# Patient Record
Sex: Male | Born: 1985 | Race: Black or African American | Hispanic: No | Marital: Single | Smoking: Current every day smoker
Health system: Southern US, Community
[De-identification: ages and names within clinical notes are randomized; demographics above are authoritative.]

## PROBLEM LIST (undated history)

## (undated) DIAGNOSIS — Z973 Presence of spectacles and contact lenses: Secondary | ICD-10-CM

## (undated) DIAGNOSIS — F1210 Cannabis abuse, uncomplicated: Secondary | ICD-10-CM

## (undated) DIAGNOSIS — E349 Endocrine disorder, unspecified: Secondary | ICD-10-CM

## (undated) DIAGNOSIS — S82142A Displaced bicondylar fracture of left tibia, initial encounter for closed fracture: Secondary | ICD-10-CM

## (undated) DIAGNOSIS — F17200 Nicotine dependence, unspecified, uncomplicated: Secondary | ICD-10-CM

## (undated) DIAGNOSIS — E559 Vitamin D deficiency, unspecified: Secondary | ICD-10-CM

---

## 2010-06-29 ENCOUNTER — Emergency Department: Payer: Self-pay | Admitting: Emergency Medicine

## 2011-12-17 ENCOUNTER — Encounter (HOSPITAL_COMMUNITY): Payer: Self-pay

## 2011-12-17 ENCOUNTER — Emergency Department (HOSPITAL_COMMUNITY): Payer: Self-pay

## 2011-12-17 ENCOUNTER — Emergency Department (HOSPITAL_COMMUNITY)
Admission: EM | Admit: 2011-12-17 | Discharge: 2011-12-17 | Disposition: A | Discharge status: Home / Self Care | Payer: Self-pay | Attending: Emergency Medicine | Admitting: Emergency Medicine

## 2011-12-17 DIAGNOSIS — Y92009 Unspecified place in unspecified non-institutional (private) residence as the place of occurrence of the external cause: Secondary | ICD-10-CM | POA: Insufficient documentation

## 2011-12-17 DIAGNOSIS — X838XXA Intentional self-harm by other specified means, initial encounter: Secondary | ICD-10-CM | POA: Insufficient documentation

## 2011-12-17 DIAGNOSIS — S62309A Unspecified fracture of unspecified metacarpal bone, initial encounter for closed fracture: Secondary | ICD-10-CM | POA: Insufficient documentation

## 2011-12-17 MED ORDER — OXYCODONE-ACETAMINOPHEN 5-325 MG PO TABS
2.0000 | ORAL_TABLET | Freq: Once | ORAL | Status: AC
  Administered 2011-12-17: 2 via ORAL
  Filled 2011-12-17: qty 2

## 2011-12-17 MED ORDER — OXYCODONE-ACETAMINOPHEN 5-325 MG PO TABS: 1.0000 | ORAL_TABLET | Freq: Four times a day (QID) | ORAL | Status: AC | PRN

## 2011-12-17 NOTE — ED Provider Notes (Signed)
History     CSN: [id number]  Arrival date & time 12/17/11  1059   First MD Initiated Contact with Patient 12/17/11 1117      Chief Complaint  Patient presents with  . Hand Injury    (Consider location/radiation/quality/duration/timing/severity/associated sxs/prior treatment) HPI Comments: Patient reports that last evening he punched a wall and is now having pain of his left hand.  He reports that he had a two previous fractures of his hand.  No bleeding from the area.  No numbness/tingling.  He has not taken anything for pain.  Patient is a 26 y.o. male presenting with hand injury. The history is provided by the patient.  Hand Injury  The incident occurred 12 to 24 hours ago. The incident occurred at home. The pain is present in the left hand. The quality of the pain is described as throbbing. The pain is moderate. The pain has been constant since the incident. Pertinent negatives include no fever. He reports no foreign bodies present. The symptoms are aggravated by palpation. He has tried nothing for the symptoms.    History reviewed. No pertinent past medical history.  History reviewed. No pertinent past surgical history.  No family history on file.  History  Substance Use Topics  . Smoking status: Current Everyday Smoker  . Smokeless tobacco: Not on file  . Alcohol Use: Yes      Review of Systems  Constitutional: Negative for fever and chills.  Gastrointestinal: Negative for nausea and vomiting.  Musculoskeletal: Positive for joint swelling.  Skin: Positive for color change.  Neurological: Negative for numbness.    Allergies  Review of patient's allergies indicates no known allergies.  Home Medications  No current outpatient prescriptions on file.  BP 132/85  Pulse 95  Temp 99 F (37.2 C) (Oral)  Resp 18  SpO2 100%  Physical Exam  Nursing note and vitals reviewed. Constitutional: He appears well-developed and well-nourished. No distress.  HENT:  Head:  Normocephalic and atraumatic.  Mouth/Throat: Oropharynx is clear and moist.  Cardiovascular: Normal rate, regular rhythm and normal heart sounds.   Pulses:      Radial pulses are 2+ on the right side, and 2+ on the left side.  Pulmonary/Chest: Effort normal and breath sounds normal.  Musculoskeletal:       Left wrist: He exhibits normal range of motion, no tenderness, no bony tenderness, no swelling and no deformity.       Swelling and tenderness to palpation of the dorsal left hand over the 4th and 5th metacarpal.  Neurological: He is alert. No sensory deficit. Gait normal.  Skin: Skin is warm, dry and intact. He is not diaphoretic.  Psychiatric: He has a normal mood and affect.    ED Course  Procedures (including critical care time)  Labs Reviewed - No data to display Dg Hand Complete Left  12/17/2011  *RADIOLOGY REPORT*  Clinical Data: Left hand pain after hitting a wall.  Previous left hand fractures.  LEFT HAND - COMPLETE 3+ VIEW  Comparison: None.  Findings: Old, healed fracture deformity of the distal fifth metacarpal.  Old, healed fracture deformity of the mid fourth metacarpal.  There is also a visible fracture line at that location with some overlying soft tissue swelling.  There is some dorsal and ulnar displacement of the distal fragment as well as ventral angulation of the distal fragment which appears chronic.  IMPRESSION:  1.  Refracture of an old, healed fourth metacarpal fracture without acute displacement. 2.  Old,  healed fifth metacarpal fracture.  Original Report Authenticated By: Darrol Angel, M.D.     No diagnosis found.    MDM  Patient with fourth metacarpal fracture without acute displacement.  Patient neurovascularly intact.  Patient given ulnar gutter splint and instructed to follow up with Orthopedics.        Pascal Lux [place], PA-C 12/17/11 2044

## 2011-12-17 NOTE — ED Notes (Signed)
Pt in from home with left hand injury states punched a wall last night present with pain and swelling to the area states pain when moving

## 2011-12-18 NOTE — ED Provider Notes (Signed)
Medical screening examination/treatment/procedure(s) were performed by non-physician practitioner and as supervising physician I was immediately available for consultation/collaboration.  Ethelda Chick, MD 12/18/11 [phone]

## 2013-08-12 ENCOUNTER — Emergency Department (HOSPITAL_COMMUNITY)
Admission: EM | Admit: 2013-08-12 | Discharge: 2013-08-12 | Disposition: A | Discharge status: Home / Self Care | Payer: Self-pay | Attending: Emergency Medicine | Admitting: Emergency Medicine

## 2013-08-12 ENCOUNTER — Encounter (HOSPITAL_COMMUNITY): Payer: Self-pay | Admitting: Emergency Medicine

## 2013-08-12 DIAGNOSIS — G479 Sleep disorder, unspecified: Secondary | ICD-10-CM | POA: Insufficient documentation

## 2013-08-12 DIAGNOSIS — K0889 Other specified disorders of teeth and supporting structures: Secondary | ICD-10-CM

## 2013-08-12 DIAGNOSIS — F17200 Nicotine dependence, unspecified, uncomplicated: Secondary | ICD-10-CM | POA: Insufficient documentation

## 2013-08-12 DIAGNOSIS — R51 Headache: Secondary | ICD-10-CM | POA: Insufficient documentation

## 2013-08-12 DIAGNOSIS — K089 Disorder of teeth and supporting structures, unspecified: Secondary | ICD-10-CM | POA: Insufficient documentation

## 2013-08-12 MED ORDER — HYDROCODONE-ACETAMINOPHEN 5-325 MG PO TABS
2.0000 | ORAL_TABLET | Freq: Once | ORAL | Status: AC
  Administered 2013-08-12: 2 via ORAL
  Filled 2013-08-12: qty 2

## 2013-08-12 MED ORDER — HYDROCODONE-ACETAMINOPHEN 5-325 MG PO TABS: 1.0000 | ORAL_TABLET | Freq: Four times a day (QID) | ORAL | Status: DC | PRN

## 2013-08-12 MED ORDER — PENICILLIN V POTASSIUM 500 MG PO TABS: 500.0000 mg | ORAL_TABLET | Freq: Three times a day (TID) | ORAL | Status: DC

## 2013-08-12 NOTE — Discharge Instructions (Signed)

## 2013-08-12 NOTE — ED Notes (Signed)
Patient states has had R upper molar pain x 2 days.   Patient states broken tooth.  Patient states has been taken generic pain pills from [facility].

## 2013-08-12 NOTE — ED Provider Notes (Signed)
CSN: [id number]     Arrival date & time 08/12/13  0818 History   First MD Initiated Contact with Patient 08/12/13 [phone]     No chief complaint on file.    (Consider location/radiation/quality/duration/timing/severity/associated sxs/prior Treatment) HPI Comments: Patient presents to the ED with a chief complaint of severe dental pain.  Patient states the pain started 2 days ago.  He reports having a broken tooth.  He states the pain is his upper left molar.  He denies any fever, chills, nausea, or vomiting.  He reports associated headache.  He has tried taking ibuprofen with no relief.  Nothing makes the symptoms better or worse.  He does not have a dentist, and requests a referral.  The history is provided by the patient. No language interpreter was used.    No past medical history on file. No past surgical history on file. No family history on file. History  Substance Use Topics  . Smoking status: Current Every Day Smoker  . Smokeless tobacco: Not on file  . Alcohol Use: Yes    Review of Systems  Constitutional: Negative for fever and chills.  HENT: Positive for dental problem. Negative for drooling.   Neurological: Negative for speech difficulty.  Psychiatric/Behavioral: Positive for sleep disturbance.      Allergies  Review of patient's allergies indicates no known allergies.  Home Medications  No current outpatient prescriptions on file. There were no vitals taken for this visit. Physical Exam  Nursing note and vitals reviewed. Constitutional: He is oriented to person, place, and time. He appears well-developed and well-nourished.  HENT:  Head: Normocephalic and atraumatic.  Mouth/Throat:    Poor dentition throughout.  Affected tooth as diagrammed.  No signs of peritonsillar or tonsillar abscess.  No signs of gingival abscess. Oropharynx is clear and without exudates.  Uvula is midline.  Airway is intact. No signs of Ludwig's angina with palpation of oral and  sublingual mucosa.   Eyes: Conjunctivae and EOM are normal.  Neck: Normal range of motion.  Cardiovascular: Normal rate.   Pulmonary/Chest: Effort normal.  Abdominal: He exhibits no distension.  Musculoskeletal: Normal range of motion.  Neurological: He is alert and oriented to person, place, and time.  Skin: Skin is dry.  Psychiatric: He has a normal mood and affect. His behavior is normal. Judgment and thought content normal.    ED Course  Procedures (including critical care time) Labs Review Labs Reviewed - No data to display Imaging Review No results found.   EKG Interpretation None      MDM   Final diagnoses:  Pain, dental    Patient with toothache.  No gross abscess.  Exam unconcerning for Ludwig's angina or spread of infection.  Will treat with penicillin and pain medicine.  Urged patient to follow-up with dentist.       Roxy Horsemanobert Nicolle Heward, PA-C 08/12/13 [phone]

## 2013-08-12 NOTE — ED Provider Notes (Signed)
Medical screening examination/treatment/procedure(s) were performed by non-physician practitioner and as supervising physician I was immediately available for consultation/collaboration.   EKG Interpretation None       Craig SouSam Divine Hansley, MD 08/12/13 [phone]

## 2014-10-16 ENCOUNTER — Encounter (HOSPITAL_COMMUNITY): Payer: Self-pay | Admitting: Emergency Medicine

## 2014-10-16 ENCOUNTER — Emergency Department (HOSPITAL_COMMUNITY): Payer: Self-pay

## 2014-10-16 ENCOUNTER — Emergency Department (HOSPITAL_COMMUNITY)
Admission: EM | Admit: 2014-10-16 | Discharge: 2014-10-16 | Disposition: A | Discharge status: Home / Self Care | Payer: Self-pay | Attending: Emergency Medicine | Admitting: Emergency Medicine

## 2014-10-16 DIAGNOSIS — Y929 Unspecified place or not applicable: Secondary | ICD-10-CM | POA: Insufficient documentation

## 2014-10-16 DIAGNOSIS — S62336A Displaced fracture of neck of fifth metacarpal bone, right hand, initial encounter for closed fracture: Secondary | ICD-10-CM | POA: Insufficient documentation

## 2014-10-16 DIAGNOSIS — Y939 Activity, unspecified: Secondary | ICD-10-CM | POA: Insufficient documentation

## 2014-10-16 DIAGNOSIS — S62308A Unspecified fracture of other metacarpal bone, initial encounter for closed fracture: Secondary | ICD-10-CM

## 2014-10-16 DIAGNOSIS — Y999 Unspecified external cause status: Secondary | ICD-10-CM | POA: Insufficient documentation

## 2014-10-16 DIAGNOSIS — S62344A Nondisplaced fracture of base of fourth metacarpal bone, right hand, initial encounter for closed fracture: Secondary | ICD-10-CM | POA: Insufficient documentation

## 2014-10-16 DIAGNOSIS — Z720 Tobacco use: Secondary | ICD-10-CM | POA: Insufficient documentation

## 2014-10-16 DIAGNOSIS — W2209XA Striking against other stationary object, initial encounter: Secondary | ICD-10-CM | POA: Insufficient documentation

## 2014-10-16 MED ORDER — NAPROXEN 500 MG PO TABS: 500.0000 mg | ORAL_TABLET | Freq: Two times a day (BID) | ORAL | Status: DC

## 2014-10-16 MED ORDER — OXYCODONE-ACETAMINOPHEN 5-325 MG PO TABS
1.0000 | ORAL_TABLET | Freq: Once | ORAL | Status: AC
  Administered 2014-10-16: 1 via ORAL
  Filled 2014-10-16: qty 1

## 2014-10-16 MED ORDER — LIDOCAINE HCL (PF) 2 % IJ SOLN
10.0000 mL | Freq: Once | INTRAMUSCULAR | Status: AC
  Administered 2014-10-16: 10 mL
  Filled 2014-10-16: qty 10

## 2014-10-16 MED ORDER — OXYCODONE-ACETAMINOPHEN 5-325 MG PO TABS: 1.0000 | ORAL_TABLET | Freq: Four times a day (QID) | ORAL | Status: DC | PRN

## 2014-10-16 NOTE — Progress Notes (Signed)
CSW spoke with patient per patient request.  Patient requesting bus passes as well as information regarding Medicaid  application.  Patient states he had Medicaid as a child but has never applied as an adult. Provided information, education regarding Medicaid application. Also supplied bus passes. No further needs expressed.Gerrie NordmannMichelle Barrett-Hilton, LCSW [phone]

## 2014-10-16 NOTE — ED Notes (Signed)
Patient states hit wall last night with R hand.   Patient has good radial pulse, good cap refill.   Patient does have limited movement of hand at this time.

## 2014-10-16 NOTE — ED Provider Notes (Signed)
CSN: [id number]     Arrival date & time 10/16/14  0722 History   First MD Initiated Contact with Patient 10/16/14 [phone]     Chief Complaint  Patient presents with  . Hand Injury    (Consider location/radiation/quality/duration/timing/severity/associated sxs/prior Treatment) HPI Comments: Patient presents with complaint of right hand pain after he punched a wall last night at approximately 10 PM. Paitent applied an Ace wrap at home. Pain and swelling worsens overnight. Patient denies numbness or tingling. The onset of this condition was acute. The course is constant. Aggravating factors: movement. Alleviating factors: none.    Patient is a 29 y.o. male presenting with hand injury. The history is provided by the patient.  Hand Injury Associated symptoms: no back pain and no neck pain     History reviewed. No pertinent past medical history. History reviewed. No pertinent past surgical history. No family history on file. History  Substance Use Topics  . Smoking status: Current Every Day Smoker -- 0.50 packs/day    Types: Cigarettes  . Smokeless tobacco: Not on file  . Alcohol Use: Yes     Comment: socially    Review of Systems  Constitutional: Positive for activity change.  Musculoskeletal: Positive for joint swelling and arthralgias. Negative for back pain, gait problem and neck pain.  Skin: Negative for wound.  Neurological: Negative for weakness and numbness.      Allergies  Vicodin  Home Medications   Prior to Admission medications   Not on File   BP 114/87 mmHg  Pulse 84  Temp(Src) 97.7 F (36.5 C) (Oral)  Resp 19  SpO2 100%   Physical Exam  Constitutional: He appears well-developed and well-nourished.  HENT:  Head: Normocephalic and atraumatic.  Eyes: Conjunctivae are normal.  Neck: Normal range of motion. Neck supple.  Cardiovascular: Normal pulses.   Musculoskeletal: He exhibits edema and tenderness.       Right hand: He exhibits decreased range of  motion, tenderness, bony tenderness, deformity and swelling. He exhibits normal capillary refill and no laceration. Normal sensation noted. Normal strength noted.       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   Neurological: He is alert. No sensory deficit.  Motor, sensation, and vascular distal to the injury is fully intact.   Skin: Skin is warm and dry.  Psychiatric: He has a normal mood and affect.  Nursing note and vitals reviewed.   ED Course  Reduction of fracture Performed by: Renne CriglerGEIPLE, Mahima Hottle Authorized by: Renne CriglerGEIPLE, Stephine Langbehn Consent: Verbal consent obtained. Risks and benefits: risks, benefits and alternatives were discussed Consent given by: patient Patient understanding: patient states understanding of the procedure being performed Imaging studies: imaging studies available (x-rays and palpation used to identify injection site) Patient identity confirmed: verbally with patient, arm band and provided demographic data Preparation: Patient was prepped and draped in the usual sterile fashion. Local anesthesia used: yes Anesthesia: hematoma block and digital block Local anesthetic: lidocaine 2% without epinephrine Anesthetic total: 5 ml Patient sedated: no Patient tolerance: Patient tolerated the procedure well with no immediate complications Comments: Once hematoma block performed patient had good anesthesia of joint but continued to have finger pain, so 3-sided ring block performed without complication with good results.    (including critical care time) Labs Review Labs Reviewed - No data to display  Imaging Review Dg Hand Complete Right  10/16/2014   CLINICAL DATA:  Patient punched wall yesterday  EXAM: RIGHT HAND - COMPLETE 3+ VIEW  COMPARISON:  None.  FINDINGS: Frontal, oblique, and lateral views obtained. There are old healed fractures of the proximal to mid fourth and fifth metacarpals with remodeling. Currently, there is an acute fracture of the  distal fifth metacarpal with volar angulation distally. There is a nondisplaced fracture of the proximal fourth metacarpal. No other fractures are identified. No dislocation. Joint spaces appear intact.  IMPRESSION: Acute fracture of the distal fifth metacarpal with volar angulation distally. Nondisplaced acute fracture proximal fourth metacarpal. Older fractures of the fourth and fifth metacarpals with remodeling involving both bones. No dislocations. No appreciable arthropathy.   Electronically Signed   By: Bretta BangWilliam  Woodruff III M.D.   On: 10/16/2014 08:04     EKG Interpretation None       8:19 AM Patient seen and examined. Medications ordered.   Vital signs reviewed and are as follows: BP 114/87 mmHg  Pulse 84  Temp(Src) 97.7 F (36.5 C) (Oral)  Resp 19  SpO2 100%  Images reviewed with Dr. Rubin PayorPickering. Will ask ortho for reccs.    I spoke with someone at Dr. Debby Budoley's office regarding this patient. After several pages, I never received a call back with recommendations.   My interpretation of x-rays shows >30 degrees of volar angulation. Spoke with Dr. Rubin PayorPickering who agrees attempt at reduction with hematoma block is warranted. I explained this to the patient and he agrees to proceed.   Hematoma block performed and attempted reduction as above. Finger remained well perfused before and after procedure with normal capillary refill with motor intact. Sensation decreased as expected.   Ulnar gutter splint placed by ortho tech.   Pt d/c to home with hand follow-up, instructed to call for an appointment asap. Patient was counseled on RICE protocol and told to rest injury, use ice for no longer than 15 minutes every hour, compress the area, and elevate above the level of their heart as much as possible to reduce swelling. Questions answered. Patient verbalized understanding.     Patient counseled on use of narcotic pain medications. Counseled not to combine these medications with others  containing tylenol. Urged not to drink alcohol, drive, or perform any other activities that requires focus while taking these medications. The patient verbalizes understanding and agrees with the plan.     MDM   Final diagnoses:  Closed fracture of 5th metacarpal, initial encounter  Closed fracture of 4th metacarpal, initial encounter   Pt with fractures with treatment as above. Referred to ortho hand.     Renne CriglerJoshua Nickayla Mcinnis, PA-C 10/16/14 1638  Benjiman CoreNathan Pickering, MD 10/17/14 [phone]

## 2014-10-16 NOTE — Progress Notes (Signed)
Orthopedic Tech Progress Note Patient Details:  Shirlyn GoltzDemaris S Monjaras December 15, 1985 [id number]  Ortho Devices Type of Ortho Device: Ace wrap, Volar splint Ortho Device/Splint Location: rue Ortho Device/Splint Interventions: Application   Shuaib Corsino 10/16/2014, 11:06 AM

## 2014-10-16 NOTE — Discharge Instructions (Signed)
Please read and follow all provided instructions.  Your diagnoses today include:  1. Closed fracture of 5th metacarpal, initial encounter   2. Closed fracture of 4th metacarpal, initial encounter     Tests performed today include:  An x-ray of the affected area - shows broken 4th and 5th metacarpal  Vital signs. See below for your results today.   Medications prescribed:   Percocet (oxycodone/acetaminophen) - narcotic pain medication  DO NOT drive or perform any activities that require you to be awake and alert because this medicine can make you drowsy. BE VERY CAREFUL not to take multiple medicines containing Tylenol (also called acetaminophen). Doing so can lead to an overdose which can damage your liver and cause liver failure and possibly death.   Naproxen - anti-inflammatory pain medication  Do not exceed 500mg  naproxen every 12 hours, take with food  You have been prescribed an anti-inflammatory medication or NSAID. Take with food. Take smallest effective dose for the shortest duration needed for your pain. Stop taking if you experience stomach pain or vomiting.   Take any prescribed medications only as directed.  Home care instructions:   Follow any educational materials contained in this packet  Follow R.I.C.E. Protocol:  R - rest your injury   I  - use ice on injury without applying directly to skin  C - compress injury with bandage or splint  E - elevate the injury as much as possible  Follow-up instructions: Please follow-up with the provided orthopedic physician (bone specialist) in the next week.  Return instructions:   Please return if your fingers are numb or tingling, appear gray or blue, or you have severe pain (also elevate the arm and loosen splint or wrap if you were given one)  Please return to the Emergency Department if you experience worsening symptoms.   Please return if you have any other emergent concerns.  Additional  Information:  Your vital signs today were: BP 114/87 mmHg   Pulse 84   Temp(Src) 97.7 F (36.5 C) (Oral)   Resp 19   SpO2 100% If your blood pressure (BP) was elevated above 135/85 this visit, please have this repeated by your doctor within one month. --------------

## 2014-10-20 ENCOUNTER — Encounter (HOSPITAL_COMMUNITY): Payer: Self-pay | Admitting: Emergency Medicine

## 2014-10-26 ENCOUNTER — Emergency Department (HOSPITAL_COMMUNITY): Payer: Self-pay

## 2014-10-26 ENCOUNTER — Encounter (HOSPITAL_COMMUNITY): Payer: Self-pay | Admitting: Emergency Medicine

## 2014-10-26 ENCOUNTER — Emergency Department (HOSPITAL_COMMUNITY)
Admission: EM | Admit: 2014-10-26 | Discharge: 2014-10-26 | Disposition: A | Discharge status: Home / Self Care | Payer: Self-pay | Attending: Emergency Medicine | Admitting: Emergency Medicine

## 2014-10-26 DIAGNOSIS — S62318K Displaced fracture of base of other metacarpal bone, subsequent encounter for fracture with nonunion: Secondary | ICD-10-CM

## 2014-10-26 DIAGNOSIS — S62316D Displaced fracture of base of fifth metacarpal bone, right hand, subsequent encounter for fracture with routine healing: Secondary | ICD-10-CM | POA: Insufficient documentation

## 2014-10-26 DIAGNOSIS — Z720 Tobacco use: Secondary | ICD-10-CM | POA: Insufficient documentation

## 2014-10-26 DIAGNOSIS — Z791 Long term (current) use of non-steroidal anti-inflammatories (NSAID): Secondary | ICD-10-CM | POA: Insufficient documentation

## 2014-10-26 DIAGNOSIS — Z792 Long term (current) use of antibiotics: Secondary | ICD-10-CM | POA: Insufficient documentation

## 2014-10-26 DIAGNOSIS — X58XXXD Exposure to other specified factors, subsequent encounter: Secondary | ICD-10-CM | POA: Insufficient documentation

## 2014-10-26 MED ORDER — OXYCODONE-ACETAMINOPHEN 5-325 MG PO TABS: 1.0000 | ORAL_TABLET | Freq: Four times a day (QID) | ORAL | Status: DC | PRN

## 2014-10-26 MED ORDER — OXYCODONE-ACETAMINOPHEN 5-325 MG PO TABS
1.0000 | ORAL_TABLET | Freq: Once | ORAL | Status: DC
  Filled 2014-10-26: qty 1

## 2014-10-26 NOTE — ED Notes (Signed)
Patient here with right hand pain, after getting cast wet.  Patient is supposed to follow up with Ortho today.  The splint got wet on Thursday.

## 2014-10-26 NOTE — ED Notes (Signed)
Ortho tech paged for splint.

## 2014-10-26 NOTE — Discharge Instructions (Signed)
You need to follow-up with your hand surgeon later today.  Hand Fracture, Fifth Metacarpal The small metacarpal is the bone at the base of the little finger between the knuckle and the wrist. A fracture is a break in that bone. One of the fractures that is common to this bone is called a Boxer's Fracture. TREATMENT These fractures can be treated with:   Reduction (bones moved back into place), then pinned through the skin to maintain the position, and then casted for about 6 weeks or as your caregiver determines necessary.  ORIF (open reduction and internal fixation) - the fracture site is opened and the bone pieces are fixed into place with pins and then casted for approximately 6 weeks or as your caregiver determines necessary. Your caregiver will discuss the type of fracture you have and the treatment that should be best for that problem. If surgery is the treatment of choice, the following is information for you to know, and also let your caregiver know about prior to surgery.  LET YOUR CAREGIVER KNOW ABOUT:  Allergies.  Medications taken including herbs, eye drops, over the counter medications, and creams.  Use of steroids (by mouth or creams).  Previous problems with anesthetics or novocaine.  Possibility of pregnancy, if this applies.  History of blood clots (thrombophlebitis).  History of bleeding or blood problems.  Previous surgery.  Other health problems. AFTER THE PROCEDURE After surgery, you will be taken to the recovery area where a nurse will watch and check your progress. Once you're awake, stable, and taking fluids well, barring other problems you'll be allowed to go home. Once home an ice pack applied to your operative site may help with discomfort and keep the swelling down. HOME CARE INSTRUCTIONS   Follow your caregiver's instructions as to activities, exercises, physical therapy, and driving a car.  Daily exercise is helpful for maintaining range of motion  (movement and mobility) and strength. Exercise as instructed.  To lessen swelling, keep the injured hand elevated above the level of your heart as much as possible.  Apply ice to the injury for 15-20 minutes each hour while awake for the first 2 days. Put the ice in a plastic bag and place a thin towel between the bag of ice and your cast.  Move the fingers of your casted hand at least several times a day.  If a plaster or fiberglass cast was applied:  Do not try to scratch the skin under the cast using a sharp or pointed object.  Check the skin around the cast every day. You may put lotion on red or sore areas.  Keep your cast dry. Your cast can be protected during bathing with a plastic bag. Do not put your cast into the water.  If a plaster splint was applied:  Wear the splint for as long as directed by your caregiver or until seen for follow-up examination.  Do not get your splint wet. Protect it during bathing with a plastic bag.  You may loosen the elastic bandage around the splint if your fingers start to get numb, tingle, get cold or turn blue.  Do not put pressure on your cast or splint; this may cause it to break. Especially, do not lean plaster casts on hard surfaces for 24 hours after application.  Take medications as directed by your caregiver.  Only take over-the-counter or prescription medicines for pain, discomfort, or fever as directed by your caregiver.  Follow all instructions for physician referrals, physical therapy,  and rehabilitation. Any delay in obtaining necessary care could result in permanent injury, disability and chronic pain. SEEK MEDICAL CARE IF:   Increased bleeding (more than a small spot) from the wound or from beneath your cast or splint if there is a wound beneath the cast from surgery.  Redness, swelling, or increasing pain in the wound or from beneath your cast or splint.  Pus coming from wound or from beneath your cast or splint.  An  unexplained oral temperature above 102 F (38.9 C) develops.  A foul smell coming from the wound or dressing or from beneath your cast or splint.  You are unable to move your little finger. SEEK IMMEDIATE MEDICAL CARE IF:  You develop a rash, have difficulty breathing, or have any allergy problems. If you do not have a window in your cast for observing the wound, a discharge or minor bleeding may show up as a stain on the outside of your cast. Report these findings to your caregiver. MAKE SURE YOU:   Understand these instructions.  Will watch your condition.  Will get help right away if you are not doing well or get worse. Document Released: 09/04/2000 Document Revised: 08/21/2011 Document Reviewed: 01/16/2008 [facility] Patient Information 2015 [employer], [place]. This information is not intended to replace advice given to you by your health care provider. Make sure you discuss any questions you have with your health care provider.

## 2014-10-26 NOTE — ED Provider Notes (Signed)
CSN: [id number]     Arrival date & time 10/26/14  [id number] History   First MD Initiated Contact with Patient 10/26/14 0325     This chart was scribed for Shon Batonourtney F Horton, MD by Arlan OrganAshley Leger, ED Scribe. This patient was seen in room A12C/A12C and the patient's care was started 3:41 AM.   Chief Complaint  Patient presents with  . Hand Injury   The history is provided by the patient. No language interpreter was used.    HPI Comments: Craig Bonilla, L hand dominant is a 29 y.o. male without any pertinent past medical history who presents to the Emergency Department complaining of constant, ongoing, progressively worsening R hand pain x 10 days. Pt was seen 5/6 for same after he punching a wall. Craig Bonilla was given a splint at time of discharge but removed the splint 3 days ago after it got wet. He admits to hitting his hand several times against hard objects. No OTC medications or home remedies attempted prior to arrival. No recent fever, chills, nausea, vomiting, or diarrhea. No weakness, loss of sensation, or paresthesia. Pt with known allergy to Vicodin.  History reviewed. No pertinent past medical history. History reviewed. No pertinent past surgical history. No family history on file. History  Substance Use Topics  . Smoking status: Current Every Day Smoker -- 0.50 packs/day    Types: Cigarettes  . Smokeless tobacco: Not on file  . Alcohol Use: Yes     Comment: socially    Review of Systems  Constitutional: Negative for fever and chills.  Gastrointestinal: Negative for nausea and vomiting.  Musculoskeletal:       Right hand pain  Skin: Negative for color change.      Allergies  Vicodin  Home Medications   Prior to Admission medications   Medication Sig Start Date End Date Taking? Authorizing Provider  naproxen (NAPROSYN) 500 MG tablet Take 1 tablet (500 mg total) by mouth 2 (two) times daily. 10/16/14   Renne CriglerJoshua Geiple, PA-C  oxyCODONE-acetaminophen (PERCOCET/ROXICET)  5-325 MG per tablet Take 1 tablet by mouth every 6 (six) hours as needed for severe pain. 10/26/14   Shon Batonourtney F Horton, MD  penicillin v potassium (VEETID) 500 MG tablet Take 1 tablet (500 mg total) by mouth 3 (three) times daily. 08/12/13   Roxy Horsemanobert Browning, PA-C   Triage Vitals: BP 91/41 mmHg  Pulse 109  Temp(Src) 97.9 F (36.6 C) (Oral)  Resp 20  Ht 5\' 9"  (1.753 m)  Wt 155 lb (70.308 kg)  BMI 22.88 kg/m2  SpO2 100%   Physical Exam  Constitutional: He is oriented to person, place, and time. He appears well-developed and well-nourished. No distress.  HENT:  Head: Normocephalic and atraumatic.  Cardiovascular: Normal rate and regular rhythm.   Pulmonary/Chest: Effort normal. No respiratory distress.  Musculoskeletal: He exhibits no edema.  Swelling and edema noted mostly over the dorsum of the right hand over the fifth metatarsal, no overlying skin changes, neurovascularly intact distally, tenderness to palpation, 2+ radial pulse  Neurological: He is alert and oriented to person, place, and time.  Skin: Skin is warm and dry.  Psychiatric: He has a normal mood and affect.  Nursing note and vitals reviewed.   ED Course  Procedures (including critical care time)  DIAGNOSTIC STUDIES: Oxygen Saturation is 99% on RA, Normal by my interpretation.    COORDINATION OF CARE: 3:57 AM- Will give Percocet. Will order DG hand complete R. Discussed treatment plan with pt at bedside and  pt agreed to plan.     Labs Review Labs Reviewed - No data to display  Imaging Review Dg Hand Complete Right  10/26/2014   ADDENDUM REPORT: 10/26/2014 05:36  ADDENDUM: RIGHT hand radiograph Oct 16, 2014 under same name, different medical record number ([id number]) reviewed, the fifth metacarpal fracture is unchanged. Linear lucency through base of fourth metacarpus favors vascular channel, less likely acute nondisplaced fracture.  Acute findings discussed with and reconfirmed by Dr.[name] on 10/26/2014 at  5:36 am.   Electronically Signed   By: Awilda Metroourtnay  Bloomer   On: 10/26/2014 05:36   10/26/2014   CLINICAL DATA:  Punched a wall 2 days ago.  EXAM: RIGHT HAND - COMPLETE 3+ VIEW  COMPARISON:  None.  FINDINGS: Acute comminuted fifth metacarpal fracture with palmar angulation distal bony fragment, no intra-articular extension. Healed mildly displaced fourth metacarpus fracture. No dislocation. No destructive bony lesions. Dorsal hand soft tissue swelling without subcutaneous gas or radiopaque foreign bodies.  IMPRESSION: Acute angulated fifth metacarpus fracture without dislocation.  Old 4th metacarpus fracture.  Electronically Signed: By: Awilda Metroourtnay  Bloomer On: 10/26/2014 05:24     EKG Interpretation None      MDM   Final diagnoses:  Fracture of fifth metacarpal bone, with nonunion, subsequent encounter    Patient presents with worsening pain over known fracture site. He took his splint off 3 days ago and pain has worsened since that time. He has not taken anything for pain. Repeat x-rays show no change in fracture alignment and no new injury. Splint was reapplied and patient is given a short course of pain medication. He states that he has a follow-up appointment later today with his hand surgeon.  Patient advised to keep this appointment and follow-up closely. There was no additional attempt to reduce fracture further given that it has been 10 days since initial injury. Will defer to hand surgeon.  After history, exam, and medical workup I feel the patient has been appropriately medically screened and is safe for discharge home. Pertinent diagnoses were discussed with the patient. Patient was given return precautions.  I personally performed the services described in this documentation, which was scribed in my presence. The recorded information has been reviewed and is accurate.   Shon Batonourtney F Horton, MD 10/26/14 [phone]

## 2015-11-14 ENCOUNTER — Inpatient Hospital Stay (HOSPITAL_COMMUNITY)
Admission: EM | Admit: 2015-11-14 | Discharge: 2015-11-17 | DRG: 563 | Disposition: A | Discharge status: Home / Self Care | Payer: Self-pay | Attending: Orthopedic Surgery | Admitting: Orthopedic Surgery

## 2015-11-14 ENCOUNTER — Emergency Department (HOSPITAL_COMMUNITY): Payer: Self-pay

## 2015-11-14 ENCOUNTER — Encounter (HOSPITAL_COMMUNITY): Payer: Self-pay | Admitting: Emergency Medicine

## 2015-11-14 DIAGNOSIS — E559 Vitamin D deficiency, unspecified: Secondary | ICD-10-CM | POA: Diagnosis present

## 2015-11-14 DIAGNOSIS — S0181XA Laceration without foreign body of other part of head, initial encounter: Secondary | ICD-10-CM | POA: Diagnosis present

## 2015-11-14 DIAGNOSIS — D62 Acute posthemorrhagic anemia: Secondary | ICD-10-CM | POA: Diagnosis present

## 2015-11-14 DIAGNOSIS — S82142A Displaced bicondylar fracture of left tibia, initial encounter for closed fracture: Principal | ICD-10-CM | POA: Diagnosis present

## 2015-11-14 DIAGNOSIS — F1210 Cannabis abuse, uncomplicated: Secondary | ICD-10-CM | POA: Diagnosis present

## 2015-11-14 DIAGNOSIS — Y908 Blood alcohol level of 240 mg/100 ml or more: Secondary | ICD-10-CM | POA: Diagnosis present

## 2015-11-14 DIAGNOSIS — F17210 Nicotine dependence, cigarettes, uncomplicated: Secondary | ICD-10-CM | POA: Diagnosis present

## 2015-11-14 DIAGNOSIS — S82143A Displaced bicondylar fracture of unspecified tibia, initial encounter for closed fracture: Secondary | ICD-10-CM | POA: Diagnosis present

## 2015-11-14 DIAGNOSIS — F10129 Alcohol abuse with intoxication, unspecified: Secondary | ICD-10-CM | POA: Diagnosis present

## 2015-11-14 DIAGNOSIS — L03116 Cellulitis of left lower limb: Secondary | ICD-10-CM | POA: Diagnosis not present

## 2015-11-14 DIAGNOSIS — F17200 Nicotine dependence, unspecified, uncomplicated: Secondary | ICD-10-CM | POA: Diagnosis present

## 2015-11-14 HISTORY — DX: Cannabis abuse, uncomplicated: F12.10

## 2015-11-14 HISTORY — DX: Nicotine dependence, unspecified, uncomplicated: F17.200

## 2015-11-14 HISTORY — DX: Vitamin D deficiency, unspecified: E55.9

## 2015-11-14 LAB — COMPREHENSIVE METABOLIC PANEL
ALT: 23 U/L (ref 17–63)
ANION GAP: 12 (ref 5–15)
AST: 46 U/L — ABNORMAL HIGH (ref 15–41)
Albumin: 3.2 g/dL — ABNORMAL LOW (ref 3.5–5.0)
Alkaline Phosphatase: 39 U/L (ref 38–126)
BUN: 9 mg/dL (ref 6–20)
CHLORIDE: 104 mmol/L (ref 101–111)
CO2: 20 mmol/L — AB (ref 22–32)
Calcium: 8.3 mg/dL — ABNORMAL LOW (ref 8.9–10.3)
Creatinine, Ser: 1.22 mg/dL (ref 0.61–1.24)
GFR calc non Af Amer: >60 mL/min (ref >60)
Glucose, Bld: 88 mg/dL (ref 65–99)
Potassium: 4.2 mmol/L (ref 3.5–5.1)
SODIUM: 136 mmol/L (ref 135–145)
Total Bilirubin: 1 mg/dL (ref 0.3–1.2)
Total Protein: 5.5 g/dL — ABNORMAL LOW (ref 6.5–8.1)

## 2015-11-14 LAB — I-STAT CG4 LACTIC ACID, ED
LACTIC ACID, VENOUS: 3.21 mmol/L — AB (ref 0.5–2.0)
Lactic Acid, Venous: 2.7 mmol/L (ref 0.5–2.0)

## 2015-11-14 LAB — ETHANOL: ALCOHOL ETHYL (B): 253 mg/dL — AB (ref <5)

## 2015-11-14 LAB — I-STAT CHEM 8, ED
BUN: 11 mg/dL (ref 6–20)
CALCIUM ION: 1.02 mmol/L — AB (ref 1.12–1.23)
Chloride: 101 mmol/L (ref 101–111)
Creatinine, Ser: 1.6 mg/dL — ABNORMAL HIGH (ref 0.61–1.24)
GLUCOSE: 80 mg/dL (ref 65–99)
HCT: 48 % (ref 39.0–52.0)
HEMOGLOBIN: 16.3 g/dL (ref 13.0–17.0)
Potassium: 4.1 mmol/L (ref 3.5–5.1)
Sodium: 136 mmol/L (ref 135–145)
TCO2: 23 mmol/L (ref 0–100)

## 2015-11-14 LAB — URINALYSIS, ROUTINE W REFLEX MICROSCOPIC
BILIRUBIN URINE: NEGATIVE
Glucose, UA: NEGATIVE mg/dL
HGB URINE DIPSTICK: NEGATIVE
Ketones, ur: NEGATIVE mg/dL
Leukocytes, UA: NEGATIVE
NITRITE: NEGATIVE
PROTEIN: NEGATIVE mg/dL
Specific Gravity, Urine: 1.019 (ref 1.005–1.030)
pH: 5 (ref 5.0–8.0)

## 2015-11-14 LAB — CBC
HCT: 42.1 % (ref 39.0–52.0)
HEMOGLOBIN: 14.3 g/dL (ref 13.0–17.0)
MCH: 31.2 pg (ref 26.0–34.0)
MCHC: 34 g/dL (ref 30.0–36.0)
MCV: 91.9 fL (ref 78.0–100.0)
Platelets: 214 10*3/uL (ref 150–400)
RBC: 4.58 MIL/uL (ref 4.22–5.81)
RDW: 13.3 % (ref 11.5–15.5)
WBC: 10.8 10*3/uL — ABNORMAL HIGH (ref 4.0–10.5)

## 2015-11-14 LAB — RAPID URINE DRUG SCREEN, HOSP PERFORMED
AMPHETAMINES: NOT DETECTED
BARBITURATES: NOT DETECTED
BENZODIAZEPINES: NOT DETECTED
COCAINE: NOT DETECTED
Opiates: NOT DETECTED
TETRAHYDROCANNABINOL: POSITIVE — AB

## 2015-11-14 LAB — PROTIME-INR
INR: 1.08 (ref 0.00–1.49)
Prothrombin Time: 14.2 seconds (ref 11.6–15.2)

## 2015-11-14 MED ORDER — HYDROMORPHONE HCL 1 MG/ML IJ SOLN
1.0000 mg | Freq: Once | INTRAMUSCULAR | Status: AC
  Administered 2015-11-14: 1 mg via INTRAVENOUS
  Filled 2015-11-14: qty 1

## 2015-11-14 MED ORDER — ONDANSETRON HCL 4 MG/2ML IJ SOLN: 4.0000 mg | Freq: Three times a day (TID) | INTRAMUSCULAR | Status: AC | PRN

## 2015-11-14 MED ORDER — SODIUM CHLORIDE 0.9 % IV BOLUS (SEPSIS)
500.0000 mL | Freq: Once | INTRAVENOUS | Status: AC
  Administered 2015-11-14: 500 mL via INTRAVENOUS

## 2015-11-14 MED ORDER — FENTANYL CITRATE (PF) 100 MCG/2ML IJ SOLN
50.0000 ug | Freq: Once | INTRAMUSCULAR | Status: AC
  Administered 2015-11-14: 50 ug via INTRAVENOUS
  Filled 2015-11-14: qty 2

## 2015-11-14 MED ORDER — SODIUM CHLORIDE 0.9 % IV SOLN
INTRAVENOUS | Status: DC
  Administered 2015-11-14: 12:00:00 via INTRAVENOUS

## 2015-11-14 MED ORDER — CEFAZOLIN SODIUM-DEXTROSE 2-4 GM/100ML-% IV SOLN
2.0000 g | INTRAVENOUS | Status: DC
  Filled 2015-11-14: qty 100

## 2015-11-14 MED ORDER — OXYCODONE HCL 5 MG PO TABS
5.0000 mg | ORAL_TABLET | Freq: Four times a day (QID) | ORAL | Status: DC | PRN
  Administered 2015-11-15 (×2): 10 mg via ORAL
  Filled 2015-11-14 (×2): qty 2

## 2015-11-14 MED ORDER — SODIUM CHLORIDE 0.9 % IV BOLUS (SEPSIS)
1000.0000 mL | Freq: Once | INTRAVENOUS | Status: AC
  Administered 2015-11-14: 1000 mL via INTRAVENOUS

## 2015-11-14 MED ORDER — IOPAMIDOL (ISOVUE-300) INJECTION 61%
INTRAVENOUS | Status: AC
  Administered 2015-11-14: 100 mL
  Filled 2015-11-14: qty 100

## 2015-11-14 MED ORDER — HYDROMORPHONE HCL 1 MG/ML IJ SOLN
1.0000 mg | INTRAMUSCULAR | Status: DC | PRN
  Administered 2015-11-15 (×4): 1 mg via INTRAVENOUS
  Filled 2015-11-14 (×4): qty 1

## 2015-11-14 MED ORDER — CHLORHEXIDINE GLUCONATE 4 % EX LIQD
60.0000 mL | Freq: Once | CUTANEOUS | Status: AC
  Administered 2015-11-15: 4 via TOPICAL
  Filled 2015-11-14: qty 60

## 2015-11-14 MED ORDER — POVIDONE-IODINE 10 % EX SWAB: 2.0000 "application " | Freq: Once | CUTANEOUS | Status: DC

## 2015-11-14 MED ORDER — HYDROMORPHONE HCL 1 MG/ML IJ SOLN
1.0000 mg | INTRAMUSCULAR | Status: AC | PRN
  Administered 2015-11-14 (×4): 1 mg via INTRAVENOUS
  Filled 2015-11-14 (×4): qty 1
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
## 2015-11-14 NOTE — ED Notes (Signed)
[name] Ortho Tech made aware of the need for Knee immobilizer.
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
## 2015-11-14 NOTE — ED Notes (Signed)
Ortho tech paged  

## 2015-11-14 NOTE — Progress Notes (Signed)
Orthopedic Tech Progress Note Patient Details:  Craig GoltzDemaris S Bonilla Jul 03, 1985 [id number]  Ortho Devices Type of Ortho Device: Knee Immobilizer Ortho Device/Splint Interventions: Application   Saul FordyceJennifer C Hendrick Pavich 11/14/2015, 8:37 AM

## 2015-11-15 DIAGNOSIS — S82143A Displaced bicondylar fracture of unspecified tibia, initial encounter for closed fracture: Secondary | ICD-10-CM | POA: Diagnosis present

## 2015-11-15 LAB — MRSA PCR SCREENING: MRSA by PCR: NEGATIVE

## 2015-11-15 LAB — CBC
HEMATOCRIT: 35.1 % — AB (ref 39.0–52.0)
HEMOGLOBIN: 11.8 g/dL — AB (ref 13.0–17.0)
MCH: 31.1 pg (ref 26.0–34.0)
MCHC: 33.6 g/dL (ref 30.0–36.0)
MCV: 92.4 fL (ref 78.0–100.0)
Platelets: 152 10*3/uL (ref 150–400)
RBC: 3.8 MIL/uL — ABNORMAL LOW (ref 4.22–5.81)
RDW: 13.1 % (ref 11.5–15.5)
WBC: 8 10*3/uL (ref 4.0–10.5)

## 2015-11-15 LAB — SAMPLE TO BLOOD BANK

## 2015-11-15 LAB — LACTIC ACID, PLASMA
Lactic Acid, Venous: 1 mmol/L (ref 0.5–2.0)
Lactic Acid, Venous: 0.9 mmol/L (ref 0.5–2.0)

## 2015-11-15 MED ORDER — POTASSIUM CHLORIDE IN NACL 20-0.9 MEQ/L-% IV SOLN
INTRAVENOUS | Status: DC
  Administered 2015-11-15 – 2015-11-17 (×4): via INTRAVENOUS
  Filled 2015-11-15 (×3): qty 1000

## 2015-11-15 MED ORDER — ACETAMINOPHEN 325 MG PO TABS: 650.0000 mg | ORAL_TABLET | Freq: Four times a day (QID) | ORAL | Status: DC | PRN

## 2015-11-15 MED ORDER — ACETAMINOPHEN 650 MG RE SUPP: 650.0000 mg | Freq: Four times a day (QID) | RECTAL | Status: DC | PRN

## 2015-11-15 MED ORDER — MAGNESIUM HYDROXIDE 400 MG/5ML PO SUSP: 30.0000 mL | Freq: Every day | ORAL | Status: DC | PRN

## 2015-11-15 MED ORDER — HYDRALAZINE HCL 20 MG/ML IJ SOLN
10.0000 mg | Freq: Once | INTRAMUSCULAR | Status: AC
  Administered 2015-11-15: 10 mg via INTRAVENOUS
  Filled 2015-11-15: qty 1

## 2015-11-15 MED ORDER — DOCUSATE SODIUM 100 MG PO CAPS
100.0000 mg | ORAL_CAPSULE | Freq: Two times a day (BID) | ORAL | Status: DC
  Administered 2015-11-15 – 2015-11-17 (×4): 100 mg via ORAL
  Filled 2015-11-15 (×4): qty 1

## 2015-11-15 MED ORDER — OXYCODONE-ACETAMINOPHEN 5-325 MG PO TABS
1.0000 | ORAL_TABLET | Freq: Four times a day (QID) | ORAL | Status: DC | PRN
  Administered 2015-11-15 – 2015-11-16 (×3): 2 via ORAL
  Administered 2015-11-16 (×2): 1 via ORAL
  Administered 2015-11-17 (×2): 2 via ORAL
  Filled 2015-11-15 (×2): qty 1
  Filled 2015-11-15 (×5): qty 2

## 2015-11-15 MED ORDER — ONDANSETRON HCL 4 MG PO TABS: 4.0000 mg | ORAL_TABLET | Freq: Four times a day (QID) | ORAL | Status: DC | PRN

## 2015-11-15 MED ORDER — OXYCODONE HCL 5 MG PO TABS
5.0000 mg | ORAL_TABLET | Freq: Four times a day (QID) | ORAL | Status: DC | PRN
  Administered 2015-11-16: 5 mg via ORAL
  Administered 2015-11-16: 10 mg via ORAL
  Filled 2015-11-15: qty 2
  Filled 2015-11-15: qty 1

## 2015-11-15 MED ORDER — ONDANSETRON HCL 4 MG/2ML IJ SOLN: 4.0000 mg | Freq: Four times a day (QID) | INTRAMUSCULAR | Status: DC | PRN

## 2015-11-15 MED ORDER — HYDROMORPHONE HCL 1 MG/ML IJ SOLN
0.5000 mg | INTRAMUSCULAR | Status: DC | PRN
  Administered 2015-11-15 – 2015-11-17 (×9): 1 mg via INTRAVENOUS
  Filled 2015-11-15 (×9): qty 1

## 2015-11-15 MED ORDER — METHOCARBAMOL 1000 MG/10ML IJ SOLN
500.0000 mg | Freq: Four times a day (QID) | INTRAVENOUS | Status: DC | PRN
  Filled 2015-11-15: qty 5

## 2015-11-15 MED ORDER — METHOCARBAMOL 500 MG PO TABS
500.0000 mg | ORAL_TABLET | Freq: Four times a day (QID) | ORAL | Status: DC | PRN
  Administered 2015-11-15 – 2015-11-17 (×5): 1000 mg via ORAL
  Filled 2015-11-15 (×5): qty 2

## 2015-11-15 NOTE — Progress Notes (Signed)
Orthopedic Tech Progress Note Patient Details:  Craig GoltzDemaris S Bonilla 1985/07/01 [id number]  Ortho Devices Type of Ortho Device: Ace wrap, Lenora BoysWatson Jones splint Ortho Device/Splint Location: lle Ortho Device/Splint Interventions: Application As ordered by PA Bonnye FavaKeith Paul  Sunni Richardson 11/15/2015, 11:33 AM
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
## 2015-11-16 LAB — CBC
HEMATOCRIT: 32.7 % — AB (ref 39.0–52.0)
Hemoglobin: 10.7 g/dL — ABNORMAL LOW (ref 13.0–17.0)
MCH: 30.6 pg (ref 26.0–34.0)
MCHC: 32.7 g/dL (ref 30.0–36.0)
MCV: 93.4 fL (ref 78.0–100.0)
PLATELETS: 140 10*3/uL — AB (ref 150–400)
RBC: 3.5 MIL/uL — AB (ref 4.22–5.81)
RDW: 13.1 % (ref 11.5–15.5)
WBC: 6.9 10*3/uL (ref 4.0–10.5)

## 2015-11-16 LAB — COMPREHENSIVE METABOLIC PANEL
ALT: 19 U/L (ref 17–63)
AST: 40 U/L (ref 15–41)
Albumin: 2.6 g/dL — ABNORMAL LOW (ref 3.5–5.0)
Alkaline Phosphatase: 39 U/L (ref 38–126)
Anion gap: 8 (ref 5–15)
BUN: <5 mg/dL — ABNORMAL LOW (ref 6–20)
CHLORIDE: 101 mmol/L (ref 101–111)
CO2: 25 mmol/L (ref 22–32)
Calcium: 8.3 mg/dL — ABNORMAL LOW (ref 8.9–10.3)
Creatinine, Ser: 1 mg/dL (ref 0.61–1.24)
Glucose, Bld: 111 mg/dL — ABNORMAL HIGH (ref 65–99)
POTASSIUM: 3.7 mmol/L (ref 3.5–5.1)
Sodium: 134 mmol/L — ABNORMAL LOW (ref 135–145)
Total Bilirubin: 1.5 mg/dL — ABNORMAL HIGH (ref 0.3–1.2)
Total Protein: 5.2 g/dL — ABNORMAL LOW (ref 6.5–8.1)

## 2015-11-16 LAB — CALCITRIOL (1,25 DI-OH VIT D): Vit D, 1,25-Dihydroxy: 29.4 pg/mL (ref 19.9–79.3)

## 2015-11-16 LAB — VITAMIN D 25 HYDROXY (VIT D DEFICIENCY, FRACTURES): Vit D, 25-Hydroxy: 7.8 ng/mL — ABNORMAL LOW (ref 30.0–100.0)
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
## 2015-11-17 ENCOUNTER — Encounter (HOSPITAL_COMMUNITY): Payer: Self-pay | Admitting: Orthopedic Surgery

## 2015-11-17 DIAGNOSIS — F10129 Alcohol abuse with intoxication, unspecified: Secondary | ICD-10-CM | POA: Diagnosis present

## 2015-11-17 DIAGNOSIS — F1210 Cannabis abuse, uncomplicated: Secondary | ICD-10-CM

## 2015-11-17 DIAGNOSIS — F17200 Nicotine dependence, unspecified, uncomplicated: Secondary | ICD-10-CM

## 2015-11-17 DIAGNOSIS — E559 Vitamin D deficiency, unspecified: Secondary | ICD-10-CM

## 2015-11-17 HISTORY — DX: Vitamin D deficiency, unspecified: E55.9

## 2015-11-17 HISTORY — DX: Cannabis abuse, uncomplicated: F12.10

## 2015-11-17 HISTORY — DX: Nicotine dependence, unspecified, uncomplicated: F17.200

## 2015-11-17 LAB — PREALBUMIN: Prealbumin: 15.2 mg/dL — ABNORMAL LOW (ref 18–38)

## 2015-11-17 LAB — TSH: TSH: 4.992 u[IU]/mL — AB (ref 0.350–4.500)

## 2015-11-17 LAB — PHOSPHORUS: PHOSPHORUS: 3.6 mg/dL (ref 2.5–4.6)

## 2015-11-17 LAB — MAGNESIUM: MAGNESIUM: 1.6 mg/dL — AB (ref 1.7–2.4)

## 2015-11-17 MED ORDER — AMOXICILLIN-POT CLAVULANATE 875-125 MG PO TABS
1.0000 | ORAL_TABLET | Freq: Two times a day (BID) | ORAL | Status: DC
  Administered 2015-11-17: 1 via ORAL
  Filled 2015-11-17: qty 1

## 2015-11-17 MED ORDER — ENOXAPARIN SODIUM 40 MG/0.4ML ~~LOC~~ SOLN
40.0000 mg | SUBCUTANEOUS | Status: DC
  Administered 2015-11-17: 40 mg via SUBCUTANEOUS
  Filled 2015-11-17: qty 0.4

## 2015-11-17 MED ORDER — OXYCODONE HCL 5 MG PO TABS: 5.0000 mg | ORAL_TABLET | Freq: Four times a day (QID) | ORAL | Status: DC | PRN

## 2015-11-17 MED ORDER — ENOXAPARIN (LOVENOX) PATIENT EDUCATION KIT: 1.0000 | PACK | Freq: Once | Status: DC

## 2015-11-17 MED ORDER — METHOCARBAMOL 500 MG PO TABS: 500.0000 mg | ORAL_TABLET | Freq: Four times a day (QID) | ORAL | Status: DC | PRN

## 2015-11-17 MED ORDER — DOCUSATE SODIUM 100 MG PO CAPS: 100.0000 mg | ORAL_CAPSULE | Freq: Two times a day (BID) | ORAL | Status: AC

## 2015-11-17 MED ORDER — AMOXICILLIN-POT CLAVULANATE 875-125 MG PO TABS: 1.0000 | ORAL_TABLET | Freq: Two times a day (BID) | ORAL | Status: DC

## 2015-11-17 MED ORDER — ENOXAPARIN SODIUM 40 MG/0.4ML ~~LOC~~ SOLN: 40.0000 mg | SUBCUTANEOUS | Status: DC

## 2015-11-17 MED ORDER — ENOXAPARIN (LOVENOX) PATIENT EDUCATION KIT
PACK | Freq: Once | Status: DC
  Filled 2015-11-17: qty 1

## 2015-11-17 MED ORDER — OXYCODONE-ACETAMINOPHEN 5-325 MG PO TABS: 1.0000 | ORAL_TABLET | Freq: Four times a day (QID) | ORAL | Status: DC | PRN

## 2015-11-17 NOTE — Progress Notes (Signed)
Pt stating he will stay for crutches to be delivered. Signed discharge information. Prescriptions given. Do not expect compliance with this patient. Lawson RadarHeather M Lamondre Wesche
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
## 2015-11-17 NOTE — Progress Notes (Addendum)
Rolling walker became available before crutches, so pt accepted rolling walker and left unit with family without waiting for brace or wheelchair transport from floor.

## 2015-11-17 NOTE — Discharge Instructions (Signed)
[facility] Discharge Instructions   General Discharge Instructions  WEIGHT BEARING STATUS: Strict Nonweightbearing Left Leg   RANGE OF MOTION/ACTIVITY: ok to move knee in hinged brace. Continue with aggressive ice and elevation  Wound Care: can remove ace wrap as needed. Would remove daily to give skin a break.   PAIN MEDICATION USE AND EXPECTATIONS  You have likely been given narcotic medications to help control your pain.  After a traumatic event that results in an fracture (broken bone) with or without surgery, it is ok to use narcotic pain medications to help control one's pain.  We understand that everyone responds to pain differently and each individual patient will be evaluated on a regular basis for the continued need for narcotic medications. Ideally, narcotic medication use should last no more than 6-8 weeks (coinciding with fracture healing).   As a patient it is your responsibility as well to monitor narcotic medication use and report the amount and frequency you use these medications when you come to your office visit.   We would also advise that if you are using narcotic medications, you should take a dose prior to therapy to maximize you participation.  IF YOU ARE ON NARCOTIC MEDICATIONS IT IS NOT PERMISSIBLE TO OPERATE A MOTOR VEHICLE (MOTORCYCLE/CAR/TRUCK/MOPED) OR HEAVY MACHINERY DO NOT MIX NARCOTICS WITH OTHER CNS (CENTRAL NERVOUS SYSTEM) DEPRESSANTS SUCH AS ALCOHOL  Diet: as you were eating previously.  Can use over the counter stool softeners and bowel preparations, such as Miralax, to help with bowel movements.  Narcotics can be constipating.  Be sure to drink plenty of fluids    STOP SMOKING OR USING NICOTINE PRODUCTS!!!!  As discussed nicotine severely impairs your body's ability to heal surgical and traumatic wounds but also impairs bone healing.  Wounds and bone heal by forming microscopic blood vessels (angiogenesis) and nicotine is a vasoconstrictor  (essentially, shrinks blood vessels).  Therefore, if vasoconstriction occurs to these microscopic blood vessels they essentially disappear and are unable to deliver necessary nutrients to the healing tissue.  This is one modifiable factor that you can do to dramatically increase your chances of healing your injury.    (This means no smoking, no nicotine gum, patches, etc)  DO NOT USE NONSTEROIDAL ANTI-INFLAMMATORY DRUGS (NSAID'S)  Using products such as Advil (ibuprofen), Aleve (naproxen), Motrin (ibuprofen) for additional pain control during fracture healing can delay and/or prevent the healing response.  If you would like to take over the counter (OTC) medication, Tylenol (acetaminophen) is ok.  However, some narcotic medications that are given for pain control contain acetaminophen as well. Therefore, you should not exceed more than 4000 mg of tylenol in a day if you do not have liver disease.  Also note that there are may OTC medicines, such as cold medicines and allergy medicines that my contain tylenol as well.  If you have any questions about medications and/or interactions please ask your doctor/PA or your pharmacist.      ICE AND ELEVATE INJURED/OPERATIVE EXTREMITY  Using ice and elevating the injured extremity above your heart can help with swelling and pain control.  Icing in a pulsatile fashion, such as 20 minutes on and 20 minutes off, can be followed.    Do not place ice directly on skin. Make sure there is a barrier between to skin and the ice pack.    Using frozen items such as frozen peas works well as the conform nicely to the are that needs to be iced.  USE AN  ACE WRAP OR TED HOSE FOR SWELLING CONTROL  In addition to icing and elevation, Ace wraps or TED hose are used to help limit and resolve swelling.  It is recommended to use Ace wraps or TED hose until you are informed to stop.    When using Ace Wraps start the wrapping distally (farthest away from the body) and wrap proximally  (closer to the body)   Example: If you had surgery on your leg or thing and you do not have a splint on, start the ace wrap at the toes and work your way up to the thigh        If you had surgery on your upper extremity and do not have a splint on, start the ace wrap at your fingers and work your way up to the upper arm  IF YOU ARE IN A SPLINT OR CAST DO NOT REMOVE IT FOR ANY REASON   If your splint gets wet for any reason please contact the office immediately. You may shower in your splint or cast as long as you keep it dry.  This can be done by wrapping in a cast cover or garbage back (or similar)  Do Not stick any thing down your splint or cast such as pencils, money, or hangers to try and scratch yourself with.  If you feel itchy take benadryl as prescribed on the bottle for itching  IF YOU ARE IN A CAM BOOT (BLACK BOOT)  You may remove boot periodically. Perform daily dressing changes as noted below.  Wash the liner of the boot regularly and wear a sock when wearing the boot. It is recommended that you sleep in the boot until told otherwise  CALL THE OFFICE WITH ANY QUESTIONS OR CONCERNS: [phone]        Discharge Pin Site Instructions  Dress pins daily with Kerlix roll starting on POD 2. Wrap the Kerlix so that it tamps the skin down around the pin-skin interface to prevent/limit motion of the skin relative to the pin.  (Pin-skin motion is the primary cause of pain and infection related to external fixator pin sites).  Remove any crust or coagulum that may obstruct drainage with a saline moistened gauze or soap and water.  After POD 3, if there is no discernable drainage on the pin site dressing, the interval for change can by increased to every other day.  You may shower with the fixator, cleaning all pin sites gently with soap and water.  If you have a surgical wound this needs to be completely dry and without drainage before showering.  The extremity can be lifted by the  fixator to facilitate wound care and transfers.  Notify the office/Doctor if you experience increasing drainage, redness, or pain from a pin site, or if you notice purulent (thick, snot-like) drainage.  Discharge Wound Care Instructions  Do NOT apply any ointments, solutions or lotions to pin sites or surgical wounds.  These prevent needed drainage and even though solutions like hydrogen peroxide kill bacteria, they also damage cells lining the pin sites that help fight infection.  Applying lotions or ointments can keep the wounds moist and can cause them to breakdown and open up as well. This can increase the risk for infection. When in doubt call the office.  Surgical incisions should be dressed daily.  If any drainage is noted, use one layer of adaptic, then gauze, Kerlix, and an ace wrap.  Once the incision is completely dry and without drainage, it  may be left open to air out.  Showering may begin 36-48 hours later.  Cleaning gently with soap and water.  Traumatic wounds should be dressed daily as well.    One layer of adaptic, gauze, Kerlix, then ace wrap.  The adaptic can be discontinued once the draining has ceased    If you have a wet to dry dressing: wet the gauze with saline the squeeze as much saline out so the gauze is moist (not soaking wet), place moistened gauze over wound, then place a dry gauze over the moist one, followed by Kerlix wrap, then ace wrap.
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
## 2015-11-17 NOTE — Progress Notes (Signed)
Orthopedic Tech Progress Note Patient Details:  Craig GoltzDemaris S Bonilla [birth year] [id number]  Patient ID: Craig Goltzemaris S [place]ury, male   DOB: [birth year], 30 y.o.   MRN: [id number] Called in advanced brace order; spoke with Loreli SlotAntoinette  Francisca Harbuck 11/17/2015, 10:14 AM
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
## 2015-11-18 LAB — PTH, INTACT AND CALCIUM
Calcium, Total (PTH): 8.9 mg/dL (ref 8.7–10.2)
PTH: 33 pg/mL (ref 15–65)

## 2015-11-18 LAB — TESTOSTERONE: TESTOSTERONE: 83 ng/dL — AB (ref 348–1197)

## 2015-11-18 LAB — SEX HORMONE BINDING GLOBULIN: SEX HORMONE BINDING: 48.5 nmol/L (ref 16.5–55.9)

## 2015-11-18 SURGERY — OPEN REDUCTION INTERNAL FIXATION (ORIF) TIBIAL PLATEAU
Anesthesia: General | Laterality: Left

## 2015-11-19 LAB — TESTOSTERONE, FREE: TESTOSTERONE FREE: 1.4 pg/mL — AB (ref 8.7–25.1)

## 2015-11-23 LAB — TESTOSTERONE, % FREE: Testosterone-% Free: 0.8 % — ABNORMAL LOW (ref 0.2–0.7)

## 2015-11-29 ENCOUNTER — Encounter (HOSPITAL_COMMUNITY): Payer: Self-pay | Admitting: *Deleted

## 2015-11-29 NOTE — Progress Notes (Signed)
Pt denies SOB, chest pain, and being under the care of a cardiologist. Pt denies having a stress test, echo and cardiac cath. Pt denies having an EKG. Pt made aware to stop taking Aspirin, vitamins, fish oil and herbal medications. Do not take any NSAIDs ie: Ibuprofen, Advil, Naproxen, BC and Goody Powder or any medication containing Aspirin. Pt verbalized understanding of all pre-op instructions.

## 2015-11-30 ENCOUNTER — Inpatient Hospital Stay (HOSPITAL_COMMUNITY): Payer: Self-pay

## 2015-11-30 ENCOUNTER — Inpatient Hospital Stay (HOSPITAL_COMMUNITY): Payer: Self-pay | Admitting: Certified Registered"

## 2015-11-30 ENCOUNTER — Encounter (HOSPITAL_COMMUNITY)
Admission: RE | Disposition: A | Discharge status: Home / Self Care | Payer: Self-pay | Source: Ambulatory Visit | Attending: Orthopedic Surgery

## 2015-11-30 ENCOUNTER — Ambulatory Visit (HOSPITAL_COMMUNITY): Payer: Self-pay

## 2015-11-30 ENCOUNTER — Ambulatory Visit (HOSPITAL_COMMUNITY)
Admission: RE | Admit: 2015-11-30 | Discharge: 2015-12-02 | Disposition: A | Discharge status: Home / Self Care | Payer: Self-pay | Source: Ambulatory Visit | Attending: Orthopedic Surgery | Admitting: Orthopedic Surgery

## 2015-11-30 DIAGNOSIS — T148XXA Other injury of unspecified body region, initial encounter: Secondary | ICD-10-CM

## 2015-11-30 DIAGNOSIS — F17200 Nicotine dependence, unspecified, uncomplicated: Secondary | ICD-10-CM | POA: Insufficient documentation

## 2015-11-30 DIAGNOSIS — S82142A Displaced bicondylar fracture of left tibia, initial encounter for closed fracture: Principal | ICD-10-CM | POA: Insufficient documentation

## 2015-11-30 DIAGNOSIS — F1210 Cannabis abuse, uncomplicated: Secondary | ICD-10-CM | POA: Insufficient documentation

## 2015-11-30 DIAGNOSIS — E559 Vitamin D deficiency, unspecified: Secondary | ICD-10-CM | POA: Insufficient documentation

## 2015-11-30 DIAGNOSIS — D62 Acute posthemorrhagic anemia: Secondary | ICD-10-CM | POA: Insufficient documentation

## 2015-11-30 DIAGNOSIS — E349 Endocrine disorder, unspecified: Secondary | ICD-10-CM | POA: Diagnosis present

## 2015-11-30 DIAGNOSIS — S82143A Displaced bicondylar fracture of unspecified tibia, initial encounter for closed fracture: Secondary | ICD-10-CM | POA: Diagnosis present

## 2015-11-30 HISTORY — PX: ORIF TIBIA PLATEAU: SHX2132

## 2015-11-30 HISTORY — DX: Presence of spectacles and contact lenses: Z97.3

## 2015-11-30 HISTORY — DX: Endocrine disorder, unspecified: E34.9

## 2015-11-30 HISTORY — DX: Displaced bicondylar fracture of left tibia, initial encounter for closed fracture: S82.142A

## 2015-11-30 LAB — CBC
HCT: 34.8 % — ABNORMAL LOW (ref 39.0–52.0)
HEMOGLOBIN: 11.4 g/dL — AB (ref 13.0–17.0)
MCH: 30.9 pg (ref 26.0–34.0)
MCHC: 32.8 g/dL (ref 30.0–36.0)
MCV: 94.3 fL (ref 78.0–100.0)
PLATELETS: 422 10*3/uL — AB (ref 150–400)
RBC: 3.69 MIL/uL — AB (ref 4.22–5.81)
RDW: 13.3 % (ref 11.5–15.5)
WBC: 11.3 10*3/uL — AB (ref 4.0–10.5)

## 2015-11-30 LAB — CREATININE, SERUM
Creatinine, Ser: 0.99 mg/dL (ref 0.61–1.24)
GFR calc non Af Amer: >60 mL/min (ref >60)

## 2015-11-30 SURGERY — OPEN REDUCTION INTERNAL FIXATION (ORIF) TIBIAL PLATEAU
Anesthesia: General | Laterality: Left

## 2015-11-30 MED ORDER — METHOCARBAMOL 500 MG PO TABS
500.0000 mg | ORAL_TABLET | Freq: Four times a day (QID) | ORAL | Status: DC | PRN
  Administered 2015-11-30: 500 mg via ORAL
  Administered 2015-12-01 (×3): 1000 mg via ORAL
  Administered 2015-12-02: 500 mg via ORAL
  Administered 2015-12-02: 1000 mg via ORAL
  Filled 2015-11-30 (×5): qty 2

## 2015-11-30 MED ORDER — CEFAZOLIN SODIUM-DEXTROSE 2-4 GM/100ML-% IV SOLN
2.0000 g | INTRAVENOUS | Status: AC
  Filled 2015-11-30: qty 100

## 2015-11-30 MED ORDER — DEXAMETHASONE SODIUM PHOSPHATE 10 MG/ML IJ SOLN
INTRAMUSCULAR | Status: DC | PRN
  Administered 2015-11-30: 10 mg via INTRAVENOUS

## 2015-11-30 MED ORDER — ONDANSETRON HCL 4 MG/2ML IJ SOLN: 4.0000 mg | Freq: Four times a day (QID) | INTRAMUSCULAR | Status: DC | PRN

## 2015-11-30 MED ORDER — MIDAZOLAM HCL 5 MG/5ML IJ SOLN
INTRAMUSCULAR | Status: DC | PRN
  Administered 2015-11-30: 2 mg via INTRAVENOUS

## 2015-11-30 MED ORDER — HYDROMORPHONE HCL 1 MG/ML IJ SOLN
INTRAMUSCULAR | Status: DC | PRN
  Administered 2015-11-30: .4 mg via INTRAVENOUS
  Administered 2015-11-30: .2 mg via INTRAVENOUS
  Administered 2015-11-30: .4 mg via INTRAVENOUS
  Administered 2015-11-30: .2 mg via INTRAVENOUS
  Administered 2015-11-30 (×2): .3 mg via INTRAVENOUS
  Administered 2015-11-30: .2 mg via INTRAVENOUS

## 2015-11-30 MED ORDER — LIDOCAINE 2% (20 MG/ML) 5 ML SYRINGE
INTRAMUSCULAR | Status: AC
  Filled 2015-11-30: qty 5

## 2015-11-30 MED ORDER — HYDROMORPHONE HCL 1 MG/ML IJ SOLN
INTRAMUSCULAR | Status: AC
  Filled 2015-11-30: qty 2

## 2015-11-30 MED ORDER — SUGAMMADEX SODIUM 200 MG/2ML IV SOLN
INTRAVENOUS | Status: AC
  Filled 2015-11-30: qty 2

## 2015-11-30 MED ORDER — LIDOCAINE HCL (CARDIAC) 20 MG/ML IV SOLN
INTRAVENOUS | Status: DC | PRN
  Administered 2015-11-30: 60 mg via INTRAVENOUS

## 2015-11-30 MED ORDER — FENTANYL CITRATE (PF) 250 MCG/5ML IJ SOLN
INTRAMUSCULAR | Status: AC
  Filled 2015-11-30: qty 5

## 2015-11-30 MED ORDER — HYDROMORPHONE HCL 1 MG/ML IJ SOLN
0.5000 mg | INTRAMUSCULAR | Status: DC | PRN
  Administered 2015-11-30 – 2015-12-01 (×3): 1 mg via INTRAVENOUS
  Filled 2015-11-30 (×3): qty 1

## 2015-11-30 MED ORDER — FENTANYL CITRATE (PF) 100 MCG/2ML IJ SOLN
INTRAMUSCULAR | Status: DC | PRN
  Administered 2015-11-30 (×8): 50 ug via INTRAVENOUS
  Administered 2015-11-30: 100 ug via INTRAVENOUS

## 2015-11-30 MED ORDER — PROPOFOL 10 MG/ML IV BOLUS
INTRAVENOUS | Status: DC | PRN
  Administered 2015-11-30: 150 mg via INTRAVENOUS
  Administered 2015-11-30: 50 mg via INTRAVENOUS

## 2015-11-30 MED ORDER — ONDANSETRON HCL 4 MG/2ML IJ SOLN
INTRAMUSCULAR | Status: AC
  Filled 2015-11-30: qty 2

## 2015-11-30 MED ORDER — ENOXAPARIN SODIUM 40 MG/0.4ML ~~LOC~~ SOLN
40.0000 mg | SUBCUTANEOUS | Status: DC
Start: 2015-12-01 — End: 2015-12-02
  Administered 2015-12-01 – 2015-12-02 (×2): 40 mg via SUBCUTANEOUS
  Filled 2015-11-30 (×2): qty 0.4

## 2015-11-30 MED ORDER — SUGAMMADEX SODIUM 200 MG/2ML IV SOLN
INTRAVENOUS | Status: DC | PRN
  Administered 2015-11-30: 125 mg via INTRAVENOUS

## 2015-11-30 MED ORDER — MIDAZOLAM HCL 2 MG/2ML IJ SOLN
INTRAMUSCULAR | Status: AC
  Filled 2015-11-30: qty 2

## 2015-11-30 MED ORDER — CEFAZOLIN SODIUM 1-5 GM-% IV SOLN
1.0000 g | Freq: Four times a day (QID) | INTRAVENOUS | Status: AC
Start: 2015-11-30 — End: 2015-12-01
  Administered 2015-11-30 – 2015-12-01 (×3): 1 g via INTRAVENOUS
  Filled 2015-11-30 (×4): qty 50

## 2015-11-30 MED ORDER — LACTATED RINGERS IV SOLN
INTRAVENOUS | Status: DC
  Administered 2015-11-30 (×4): via INTRAVENOUS

## 2015-11-30 MED ORDER — MEPERIDINE HCL 25 MG/ML IJ SOLN: 6.2500 mg | INTRAMUSCULAR | Status: DC | PRN

## 2015-11-30 MED ORDER — OXYCODONE HCL 5 MG PO TABS
5.0000 mg | ORAL_TABLET | ORAL | Status: DC | PRN
  Administered 2015-11-30 – 2015-12-02 (×8): 10 mg via ORAL
  Filled 2015-11-30 (×9): qty 2

## 2015-11-30 MED ORDER — OXYCODONE-ACETAMINOPHEN 7.5-325 MG PO TABS
1.0000 | ORAL_TABLET | Freq: Four times a day (QID) | ORAL | Status: DC | PRN
  Administered 2015-12-01 – 2015-12-02 (×6): 2 via ORAL
  Filled 2015-11-30 (×6): qty 2

## 2015-11-30 MED ORDER — PROPOFOL 10 MG/ML IV BOLUS
INTRAVENOUS | Status: AC
  Filled 2015-11-30: qty 20

## 2015-11-30 MED ORDER — METHOCARBAMOL 500 MG PO TABS
ORAL_TABLET | ORAL | Status: AC
  Filled 2015-11-30: qty 1

## 2015-11-30 MED ORDER — ACETAMINOPHEN 650 MG RE SUPP: 650.0000 mg | Freq: Four times a day (QID) | RECTAL | Status: DC | PRN

## 2015-11-30 MED ORDER — DOCUSATE SODIUM 100 MG PO CAPS
100.0000 mg | ORAL_CAPSULE | Freq: Two times a day (BID) | ORAL | Status: DC
  Administered 2015-11-30 – 2015-12-02 (×4): 100 mg via ORAL
  Filled 2015-11-30 (×4): qty 1

## 2015-11-30 MED ORDER — CEFAZOLIN SODIUM-DEXTROSE 2-4 GM/100ML-% IV SOLN
2.0000 g | Freq: Once | INTRAVENOUS | Status: AC
  Administered 2015-11-30: 2 g via INTRAVENOUS
  Filled 2015-11-30: qty 100

## 2015-11-30 MED ORDER — PROMETHAZINE HCL 25 MG/ML IJ SOLN: 6.2500 mg | INTRAMUSCULAR | Status: DC | PRN

## 2015-11-30 MED ORDER — HYDROMORPHONE HCL 1 MG/ML IJ SOLN
INTRAMUSCULAR | Status: AC
  Filled 2015-11-30: qty 1

## 2015-11-30 MED ORDER — POTASSIUM CHLORIDE IN NACL 20-0.9 MEQ/L-% IV SOLN
INTRAVENOUS | Status: DC
  Administered 2015-11-30: 75 mL/h via INTRAVENOUS
  Filled 2015-11-30: qty 1000

## 2015-11-30 MED ORDER — LACTATED RINGERS IV SOLN: INTRAVENOUS | Status: DC

## 2015-11-30 MED ORDER — ROCURONIUM BROMIDE 100 MG/10ML IV SOLN
INTRAVENOUS | Status: DC | PRN
  Administered 2015-11-30: 50 mg via INTRAVENOUS

## 2015-11-30 MED ORDER — DEXAMETHASONE SODIUM PHOSPHATE 10 MG/ML IJ SOLN
INTRAMUSCULAR | Status: AC
  Filled 2015-11-30: qty 1

## 2015-11-30 MED ORDER — ACETAMINOPHEN 500 MG PO TABS
1000.0000 mg | ORAL_TABLET | Freq: Once | ORAL | Status: AC
  Administered 2015-11-30: 1000 mg via ORAL
  Filled 2015-11-30: qty 2

## 2015-11-30 MED ORDER — ONDANSETRON HCL 4 MG PO TABS: 4.0000 mg | ORAL_TABLET | Freq: Four times a day (QID) | ORAL | Status: DC | PRN

## 2015-11-30 MED ORDER — CHLORHEXIDINE GLUCONATE 4 % EX LIQD
60.0000 mL | Freq: Once | CUTANEOUS | Status: DC
  Filled 2015-11-30: qty 60

## 2015-11-30 MED ORDER — OXYCODONE HCL 5 MG PO TABS
ORAL_TABLET | ORAL | Status: AC
  Filled 2015-11-30: qty 2

## 2015-11-30 MED ORDER — 0.9 % SODIUM CHLORIDE (POUR BTL) OPTIME
TOPICAL | Status: DC | PRN
  Administered 2015-11-30: 1000 mL

## 2015-11-30 MED ORDER — HYDROMORPHONE HCL 1 MG/ML IJ SOLN
0.2500 mg | INTRAMUSCULAR | Status: DC | PRN
  Administered 2015-11-30 (×4): 0.5 mg via INTRAVENOUS

## 2015-11-30 MED ORDER — CEFAZOLIN SODIUM 1 G IJ SOLR
INTRAMUSCULAR | Status: AC
  Filled 2015-11-30: qty 20

## 2015-11-30 MED ORDER — METOCLOPRAMIDE HCL 5 MG PO TABS: 5.0000 mg | ORAL_TABLET | Freq: Three times a day (TID) | ORAL | Status: DC | PRN

## 2015-11-30 MED ORDER — ROCURONIUM BROMIDE 50 MG/5ML IV SOLN
INTRAVENOUS | Status: AC
Start: 2015-11-30 — End: 2015-11-30
  Filled 2015-11-30: qty 1

## 2015-11-30 MED ORDER — METOCLOPRAMIDE HCL 5 MG/ML IJ SOLN: 5.0000 mg | Freq: Three times a day (TID) | INTRAMUSCULAR | Status: DC | PRN

## 2015-11-30 MED ORDER — ACETAMINOPHEN 325 MG PO TABS: 650.0000 mg | ORAL_TABLET | Freq: Four times a day (QID) | ORAL | Status: DC | PRN

## 2015-11-30 MED ORDER — ONDANSETRON HCL 4 MG/2ML IJ SOLN
INTRAMUSCULAR | Status: DC | PRN
  Administered 2015-11-30 (×2): 4 mg via INTRAVENOUS

## 2015-11-30 MED ORDER — HYDROMORPHONE HCL 1 MG/ML IJ SOLN
INTRAMUSCULAR | Status: AC
Start: 2015-11-30 — End: 2015-11-30
  Filled 2015-11-30: qty 1

## 2015-11-30 MED ORDER — METHOCARBAMOL 1000 MG/10ML IJ SOLN: 1000.0000 mg | Freq: Four times a day (QID) | INTRAVENOUS | Status: DC | PRN

## 2015-11-30 SURGICAL SUPPLY — 98 items
BANDAGE ACE 4X5 VEL STRL LF (GAUZE/BANDAGES/DRESSINGS) ×3 IMPLANT
BANDAGE ACE 6X5 VEL STRL LF (GAUZE/BANDAGES/DRESSINGS) ×3 IMPLANT
BANDAGE ELASTIC 4 VELCRO ST LF (GAUZE/BANDAGES/DRESSINGS) ×3 IMPLANT
BANDAGE ELASTIC 6 VELCRO ST LF (GAUZE/BANDAGES/DRESSINGS) ×3 IMPLANT
BANDAGE ESMARK 6X9 LF (GAUZE/BANDAGES/DRESSINGS) ×1 IMPLANT
BIT DRILL 2.5X110 QC LCP DISP (BIT) ×3 IMPLANT
BIT DRILL 2.8 (BIT) ×1
BIT DRILL CANN QC 2.8X165 (BIT) ×1 IMPLANT
BLADE SURG 10 STRL SS (BLADE) ×3 IMPLANT
BLADE SURG 15 STRL LF DISP TIS (BLADE) ×1 IMPLANT
BLADE SURG 15 STRL SS (BLADE) ×2
BLADE SURG ROTATE 9660 (MISCELLANEOUS) IMPLANT
BNDG COHESIVE 4X5 TAN STRL (GAUZE/BANDAGES/DRESSINGS) ×3 IMPLANT
BNDG ESMARK 6X9 LF (GAUZE/BANDAGES/DRESSINGS) ×3
BNDG GAUZE ELAST 4 BULKY (GAUZE/BANDAGES/DRESSINGS) ×3 IMPLANT
BONE CANC CHIPS 20CC PCAN1/4 (Bone Implant) ×3 IMPLANT
BRUSH SCRUB DISP (MISCELLANEOUS) ×6 IMPLANT
CANISTER SUCT 3000ML PPV (MISCELLANEOUS) ×3 IMPLANT
CHIPS CANC BONE 20CC PCAN1/4 (Bone Implant) ×1 IMPLANT
COVER MAYO STAND STRL (DRAPES) ×3 IMPLANT
COVER SURGICAL LIGHT HANDLE (MISCELLANEOUS) ×3 IMPLANT
CUFF TOURNIQUET SINGLE 34IN LL (TOURNIQUET CUFF) ×3 IMPLANT
DRAPE C-ARM 42X72 X-RAY (DRAPES) ×3 IMPLANT
DRAPE C-ARMOR (DRAPES) ×3 IMPLANT
DRAPE EXTREMITY T 121X128X90 (DRAPE) IMPLANT
DRAPE INCISE IOBAN 66X45 STRL (DRAPES) ×3 IMPLANT
DRAPE ORTHO SPLIT 77X108 STRL (DRAPES)
DRAPE SURG ORHT 6 SPLT 77X108 (DRAPES) IMPLANT
DRAPE U-SHAPE 47X51 STRL (DRAPES) ×3 IMPLANT
DRILL BIT 2.8MM (BIT) ×2
DRSG ADAPTIC 3X8 NADH LF (GAUZE/BANDAGES/DRESSINGS) ×3 IMPLANT
DRSG PAD ABDOMINAL 8X10 ST (GAUZE/BANDAGES/DRESSINGS) ×12 IMPLANT
ELECT REM PT RETURN 9FT ADLT (ELECTROSURGICAL) ×3
ELECTRODE REM PT RTRN 9FT ADLT (ELECTROSURGICAL) ×1 IMPLANT
EVACUATOR 1/8 PVC DRAIN (DRAIN) IMPLANT
EVACUATOR 3/16  PVC DRAIN (DRAIN)
EVACUATOR 3/16 PVC DRAIN (DRAIN) IMPLANT
GAUZE SPONGE 4X4 12PLY STRL (GAUZE/BANDAGES/DRESSINGS) ×3 IMPLANT
GLOVE BIO SURGEON STRL SZ 6.5 (GLOVE) ×6 IMPLANT
GLOVE BIO SURGEON STRL SZ7.5 (GLOVE) ×3 IMPLANT
GLOVE BIO SURGEON STRL SZ8 (GLOVE) ×3 IMPLANT
GLOVE BIO SURGEONS STRL SZ 6.5 (GLOVE) ×3
GLOVE BIOGEL M 6.5 STRL (GLOVE) ×12 IMPLANT
GLOVE BIOGEL PI IND STRL 7.5 (GLOVE) ×1 IMPLANT
GLOVE BIOGEL PI IND STRL 8 (GLOVE) ×1 IMPLANT
GLOVE BIOGEL PI INDICATOR 7.5 (GLOVE) ×2
GLOVE BIOGEL PI INDICATOR 8 (GLOVE) ×2
GOWN STRL REUS W/ TWL LRG LVL3 (GOWN DISPOSABLE) ×3 IMPLANT
GOWN STRL REUS W/ TWL XL LVL3 (GOWN DISPOSABLE) ×1 IMPLANT
GOWN STRL REUS W/TWL LRG LVL3 (GOWN DISPOSABLE) ×6
GOWN STRL REUS W/TWL XL LVL3 (GOWN DISPOSABLE) ×2
IMMOBILIZER KNEE 22 (SOFTGOODS) ×3 IMPLANT
IMMOBILIZER KNEE 22 UNIV (SOFTGOODS) ×3 IMPLANT
K-WIRE 1.6X150 (WIRE) ×6
K-WIRE 2.0X150M (WIRE) ×3
KIT BASIN OR (CUSTOM PROCEDURE TRAY) ×3 IMPLANT
KIT ROOM TURNOVER OR (KITS) ×3 IMPLANT
KWIRE 1.6X150 (WIRE) ×2 IMPLANT
KWIRE 2.0X150M (WIRE) ×1 IMPLANT
NDL SUT 6 .5 CRC .975X.05 MAYO (NEEDLE) IMPLANT
NEEDLE 22X1 1/2 (OR ONLY) (NEEDLE) IMPLANT
NEEDLE MAYO TAPER (NEEDLE)
NS IRRIG 1000ML POUR BTL (IV SOLUTION) ×3 IMPLANT
PACK ORTHO EXTREMITY (CUSTOM PROCEDURE TRAY) ×3 IMPLANT
PAD ABD 8X10 STRL (GAUZE/BANDAGES/DRESSINGS) ×3 IMPLANT
PAD ARMBOARD 7.5X6 YLW CONV (MISCELLANEOUS) ×6 IMPLANT
PAD CAST 4YDX4 CTTN HI CHSV (CAST SUPPLIES) ×1 IMPLANT
PADDING CAST COTTON 4X4 STRL (CAST SUPPLIES) ×2
PADDING CAST COTTON 6X4 STRL (CAST SUPPLIES) ×3 IMPLANT
PLATE TIBIA ROX MEDIAL LCP 3.5 (Plate) ×3 IMPLANT
SCREW 3.5 CONICAL 75MM (Screw) ×6 IMPLANT
SCREW 3.5 CONICAL 80MM (Screw) ×6 IMPLANT
SCREW CORTEX 3.5 34MM (Screw) ×2 IMPLANT
SCREW CORTEX 3.5 36MM (Screw) ×2 IMPLANT
SCREW LOCK CORT ST 3.5X34 (Screw) ×1 IMPLANT
SCREW LOCK CORT ST 3.5X36 (Screw) ×1 IMPLANT
SPONGE GAUZE 4X4 12PLY STER LF (GAUZE/BANDAGES/DRESSINGS) ×3 IMPLANT
SPONGE LAP 18X18 X RAY DECT (DISPOSABLE) ×3 IMPLANT
STAPLER VISISTAT 35W (STAPLE) ×3 IMPLANT
STOCKINETTE IMPERVIOUS LG (DRAPES) ×3 IMPLANT
SUCTION FRAZIER HANDLE 10FR (MISCELLANEOUS) ×2
SUCTION TUBE FRAZIER 10FR DISP (MISCELLANEOUS) ×1 IMPLANT
SUT ETHILON 3 0 PS 1 (SUTURE) ×9 IMPLANT
SUT PROLENE 0 CT 2 (SUTURE) IMPLANT
SUT VIC AB 0 CT1 27 (SUTURE) ×2
SUT VIC AB 0 CT1 27XBRD ANBCTR (SUTURE) ×1 IMPLANT
SUT VIC AB 1 CT1 27 (SUTURE) ×2
SUT VIC AB 1 CT1 27XBRD ANBCTR (SUTURE) ×1 IMPLANT
SUT VIC AB 2-0 CT1 27 (SUTURE) ×4
SUT VIC AB 2-0 CT1 TAPERPNT 27 (SUTURE) ×2 IMPLANT
SYR 20ML ECCENTRIC (SYRINGE) IMPLANT
TOWEL OR 17X24 6PK STRL BLUE (TOWEL DISPOSABLE) ×3 IMPLANT
TOWEL OR 17X26 10 PK STRL BLUE (TOWEL DISPOSABLE) ×6 IMPLANT
TRAY FOLEY CATH 16FRSI W/METER (SET/KITS/TRAYS/PACK) ×3 IMPLANT
TUBE CONNECTING 12'X1/4 (SUCTIONS) ×1
TUBE CONNECTING 12X1/4 (SUCTIONS) ×2 IMPLANT
WATER STERILE IRR 1000ML POUR (IV SOLUTION) IMPLANT
YANKAUER SUCT BULB TIP NO VENT (SUCTIONS) ×3 IMPLANT

## 2015-11-30 NOTE — Anesthesia Procedure Notes (Signed)
Procedure Name: Intubation Date/Time: 11/30/2015 3:26 PM Performed by: Army FossaPULLIAM, Misti Towle DANE Pre-anesthesia Checklist: Patient identified, Emergency Drugs available, Suction available, Patient being monitored and Timeout performed Patient Re-evaluated:Patient Re-evaluated prior to inductionOxygen Delivery Method: Circle system utilized Preoxygenation: Pre-oxygenation with 100% oxygen Intubation Type: IV induction Ventilation: Mask ventilation without difficulty Laryngoscope Size: Mac and 3 Grade View: Grade I Tube size: 7.0 mm Number of attempts: 1 Airway Equipment and Method: Patient positioned with wedge pillow and Stylet Placement Confirmation: ETT inserted through vocal cords under direct vision,  positive ETCO2,  CO2 detector and breath sounds checked- equal and bilateral Secured at: 23 cm Tube secured with: Tape Dental Injury: Teeth and Oropharynx as per pre-operative assessment

## 2015-11-30 NOTE — Transfer of Care (Signed)
Immediate Anesthesia Transfer of Care Note  Patient: Craig Bonilla  Procedure(s) Performed: Procedure(s): OPEN REDUCTION INTERNAL FIXATION (ORIF) LEFT TIBIAL PLATEAU (Left)  Patient Location: PACU  Anesthesia Type:General  Level of Consciousness:  sedated, patient cooperative and responds to stimulation  Airway & Oxygen Therapy:Patient Spontanous Breathing and Patient connected to face mask oxgen  Post-op Assessment:  Report given to PACU RN and Post -op Vital signs reviewed and stable  Post vital signs:  Reviewed and stable  Last Vitals:  Filed Vitals:   11/30/15 1003  BP: 123/88  Pulse: 96  Temp: 36.8 C  Resp: 18    Complications: No apparent anesthesia complications

## 2015-11-30 NOTE — Anesthesia Preprocedure Evaluation (Addendum)
Anesthesia Evaluation  Patient identified by MRN, date of birth, ID band Patient awake    Reviewed: Allergy & Precautions, NPO status , Patient's Chart, lab work & pertinent test results  Airway Mallampati: II  TM Distance: >3 FB Neck ROM: Full    Dental  (+) Teeth Intact, Dental Advisory Given   Pulmonary Current Smoker,    breath sounds clear to auscultation       Cardiovascular negative cardio ROS   Rhythm:Regular Rate:Normal     Neuro/Psych negative neurological ROS  negative psych ROS   GI/Hepatic negative GI ROS, Neg liver ROS,   Endo/Other  negative endocrine ROS  Renal/GU negative Renal ROS  negative genitourinary   Musculoskeletal negative musculoskeletal ROS (+)   Abdominal   Peds negative pediatric ROS (+)  Hematology negative hematology ROS (+)   Anesthesia Other Findings   Reproductive/Obstetrics negative OB ROS                            Lab Results  Component Value Date   WBC 6.9 11/16/2015   HGB 10.7* 11/16/2015   HCT 32.7* 11/16/2015   MCV 93.4 11/16/2015   PLT 140* 11/16/2015   Lab Results  Component Value Date   CREATININE 1.00 11/16/2015   BUN <5* 11/16/2015   NA 134* 11/16/2015   K 3.7 11/16/2015   CL 101 11/16/2015   CO2 25 11/16/2015   Lab Results  Component Value Date   INR 1.08 11/14/2015     Anesthesia Physical Anesthesia Plan  ASA: II  Anesthesia Plan: General   Post-op Pain Management:    Induction: Intravenous  Airway Management Planned: Oral ETT  Additional Equipment:   Intra-op Plan:   Post-operative Plan: Extubation in OR  Informed Consent: I have reviewed the patients History and Physical, chart, labs and discussed the procedure including the risks, benefits and alternatives for the proposed anesthesia with the patient or authorized representative who has indicated his/her understanding and acceptance.   Dental advisory  given  Plan Discussed with: CRNA  Anesthesia Plan Comments:         Anesthesia Quick Evaluation

## 2015-11-30 NOTE — Brief Op Note (Signed)
11/30/2015  5:06 PM  PATIENT:  Craig Bonilla  30 y.o. male  PRE-OPERATIVE DIAGNOSIS:  LEFT TIBIAL BICONDYLAR PLATEAU FRACTURE   POST-OPERATIVE DIAGNOSIS:  LEFT TIBIAL BICONDYLAR PLATEAU FRACTURE  PROCEDURE:  Procedure(s): OPEN REDUCTION INTERNAL FIXATION (ORIF) LEFT TIBIAL PLATEAU (Left), BICONDYLAR  SURGEON:  Surgeon(s) and Role:    * Myrene GalasMichael Kyrollos Cordell, MD - Primary  PHYSICIAN ASSISTANT: Montez MoritaKeith Paul, PA-C  ANESTHESIA:   general  I/O:  Total I/O In: 2000 [I.V.:2000] Out: 150 [Urine:150]  SPECIMEN:  No Specimen  TOURNIQUET:  120 MIN  DICTATION: [id number]
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
## 2015-12-01 ENCOUNTER — Encounter (HOSPITAL_COMMUNITY): Payer: Self-pay | Admitting: Orthopedic Surgery
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
## 2015-12-01 NOTE — Progress Notes (Signed)
Orthopedic Tech Progress Note Patient Details:  Craig GoltzDemaris S Bonilla [birth year] [id number]  Ortho Devices Ortho Device/Splint Location: trapeze bar patient helper Ortho Device/Splint Interventions: Application   Nikki Domrawford, Roselani Grajeda 12/01/2015, 6:22 AM

## 2015-12-01 NOTE — Progress Notes (Signed)
Orthopedic Tech Progress Note Patient Details:  Craig GoltzDemaris S Bonilla 1986/04/30 [id number]  Patient ID: Craig Bonilla, male   DOB: 1986/04/30, 30 y.o.   MRN: [id number] Called in advanced brace order; spoke with Ferdinand LangoKanisha  Craig Bonilla 12/01/2015, 10:33 AM
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
## 2015-12-02 ENCOUNTER — Encounter (HOSPITAL_COMMUNITY): Payer: Self-pay | Admitting: Orthopedic Surgery

## 2015-12-02 DIAGNOSIS — E349 Endocrine disorder, unspecified: Secondary | ICD-10-CM

## 2015-12-02 HISTORY — DX: Endocrine disorder, unspecified: E34.9

## 2015-12-02 MED ORDER — ASPIRIN EC 325 MG PO TBEC: 325.0000 mg | DELAYED_RELEASE_TABLET | Freq: Every day | ORAL | Status: DC

## 2015-12-02 MED ORDER — VITAMIN D 1000 UNITS PO TABS
2000.0000 [IU] | ORAL_TABLET | Freq: Two times a day (BID) | ORAL | Status: DC
  Administered 2015-12-02: 2000 [IU] via ORAL
  Filled 2015-12-02: qty 2

## 2015-12-02 MED ORDER — OXYCODONE HCL 5 MG PO TABS: 5.0000 mg | ORAL_TABLET | Freq: Four times a day (QID) | ORAL | Status: DC | PRN

## 2015-12-02 MED ORDER — OXYCODONE-ACETAMINOPHEN 7.5-325 MG PO TABS: 1.0000 | ORAL_TABLET | Freq: Four times a day (QID) | ORAL | Status: DC | PRN

## 2015-12-02 MED ORDER — CHOLECALCIFEROL 50 MCG (2000 UT) PO TABS: 2000.0000 [IU] | ORAL_TABLET | Freq: Two times a day (BID) | ORAL | Status: DC

## 2015-12-02 NOTE — Progress Notes (Signed)
Pt ready for discharge. Education/instructions reviewed with pt and all questions/concerns addressed. IV removed and belongings gathered. Pt will be transported out via wheelchair to cousin's car. Will continue to monitor.
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
## 2015-12-02 NOTE — Discharge Instructions (Signed)
[facility] Discharge Instructions   General Discharge Instructions  WEIGHT BEARING STATUS: Nonweightbearing Left leg  RANGE OF MOTION/ACTIVITY: unrestricted range of motion left knee  Wound Care: daily wound care/dressing changes starting on 12/04/2015. See detailed instructions below   PAIN MEDICATION USE AND EXPECTATIONS  You have likely been given narcotic medications to help control your pain.  After a traumatic event that results in an fracture (broken bone) with or without surgery, it is ok to use narcotic pain medications to help control one's pain.  We understand that everyone responds to pain differently and each individual patient will be evaluated on a regular basis for the continued need for narcotic medications. Ideally, narcotic medication use should last no more than 6-8 weeks (coinciding with fracture healing).   As a patient it is your responsibility as well to monitor narcotic medication use and report the amount and frequency you use these medications when you come to your office visit.   We would also advise that if you are using narcotic medications, you should take a dose prior to therapy to maximize you participation.  IF YOU ARE ON NARCOTIC MEDICATIONS IT IS NOT PERMISSIBLE TO OPERATE A MOTOR VEHICLE (MOTORCYCLE/CAR/TRUCK/MOPED) OR HEAVY MACHINERY DO NOT MIX NARCOTICS WITH OTHER CNS (CENTRAL NERVOUS SYSTEM) DEPRESSANTS SUCH AS ALCOHOL  Diet: as you were eating previously.  Can use over the counter stool softeners and bowel preparations, such as Miralax, to help with bowel movements.  Narcotics can be constipating.  Be sure to drink plenty of fluids    STOP SMOKING OR USING NICOTINE PRODUCTS!!!!  As discussed nicotine severely impairs your body's ability to heal surgical and traumatic wounds but also impairs bone healing.  Wounds and bone heal by forming microscopic blood vessels (angiogenesis) and nicotine is a vasoconstrictor (essentially, shrinks blood  vessels).  Therefore, if vasoconstriction occurs to these microscopic blood vessels they essentially disappear and are unable to deliver necessary nutrients to the healing tissue.  This is one modifiable factor that you can do to dramatically increase your chances of healing your injury.    (This means no smoking, no nicotine gum, patches, etc)  DO NOT USE NONSTEROIDAL ANTI-INFLAMMATORY DRUGS (NSAID'S)  Using products such as Advil (ibuprofen), Aleve (naproxen), Motrin (ibuprofen) for additional pain control during fracture healing can delay and/or prevent the healing response.  If you would like to take over the counter (OTC) medication, Tylenol (acetaminophen) is ok.  However, some narcotic medications that are given for pain control contain acetaminophen as well. Therefore, you should not exceed more than 4000 mg of tylenol in a day if you do not have liver disease.  Also note that there are may OTC medicines, such as cold medicines and allergy medicines that my contain tylenol as well.  If you have any questions about medications and/or interactions please ask your doctor/PA or your pharmacist.      ICE AND ELEVATE INJURED/OPERATIVE EXTREMITY  Using ice and elevating the injured extremity above your heart can help with swelling and pain control.  Icing in a pulsatile fashion, such as 20 minutes on and 20 minutes off, can be followed.    Do not place ice directly on skin. Make sure there is a barrier between to skin and the ice pack.    Using frozen items such as frozen peas works well as the conform nicely to the are that needs to be iced.  USE AN ACE WRAP OR TED HOSE FOR SWELLING CONTROL  In addition to  icing and elevation, Ace wraps or TED hose are used to help limit and resolve swelling.  It is recommended to use Ace wraps or TED hose until you are informed to stop.    When using Ace Wraps start the wrapping distally (farthest away from the body) and wrap proximally (closer to the  body)   Example: If you had surgery on your leg or thing and you do not have a splint on, start the ace wrap at the toes and work your way up to the thigh        If you had surgery on your upper extremity and do not have a splint on, start the ace wrap at your fingers and work your way up to the upper arm  IF YOU ARE IN A SPLINT OR CAST DO NOT REMOVE IT FOR ANY REASON   If your splint gets wet for any reason please contact the office immediately. You may shower in your splint or cast as long as you keep it dry.  This can be done by wrapping in a cast cover or garbage back (or similar)  Do Not stick any thing down your splint or cast such as pencils, money, or hangers to try and scratch yourself with.  If you feel itchy take benadryl as prescribed on the bottle for itching  IF YOU ARE IN A CAM BOOT (BLACK BOOT)  You may remove boot periodically. Perform daily dressing changes as noted below.  Wash the liner of the boot regularly and wear a sock when wearing the boot. It is recommended that you sleep in the boot until told otherwise  CALL THE OFFICE WITH ANY QUESTIONS OR CONCERNS: [phone]        Discharge Pin Site Instructions  Dress pins daily with Kerlix roll starting on POD 2. Wrap the Kerlix so that it tamps the skin down around the pin-skin interface to prevent/limit motion of the skin relative to the pin.  (Pin-skin motion is the primary cause of pain and infection related to external fixator pin sites).  Remove any crust or coagulum that may obstruct drainage with a saline moistened gauze or soap and water.  After POD 3, if there is no discernable drainage on the pin site dressing, the interval for change can by increased to every other day.  You may shower with the fixator, cleaning all pin sites gently with soap and water.  If you have a surgical wound this needs to be completely dry and without drainage before showering.  The extremity can be lifted by the fixator to  facilitate wound care and transfers.  Notify the office/Doctor if you experience increasing drainage, redness, or pain from a pin site, or if you notice purulent (thick, snot-like) drainage.  Discharge Wound Care Instructions  Do NOT apply any ointments, solutions or lotions to pin sites or surgical wounds.  These prevent needed drainage and even though solutions like hydrogen peroxide kill bacteria, they also damage cells lining the pin sites that help fight infection.  Applying lotions or ointments can keep the wounds moist and can cause them to breakdown and open up as well. This can increase the risk for infection. When in doubt call the office.  Surgical incisions should be dressed daily.  If any drainage is noted, use one layer of adaptic, then gauze, Kerlix, and an ace wrap.  Once the incision is completely dry and without drainage, it may be left open to air out.  Showering may begin 36-48  hours later.  Cleaning gently with soap and water.  Traumatic wounds should be dressed daily as well.    One layer of adaptic, gauze, Kerlix, then ace wrap.  The adaptic can be discontinued once the draining has ceased    If you have a wet to dry dressing: wet the gauze with saline the squeeze as much saline out so the gauze is moist (not soaking wet), place moistened gauze over wound, then place a dry gauze over the moist one, followed by Kerlix wrap, then ace wrap.
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
## 2015-12-02 NOTE — Progress Notes (Signed)
[facility] Progress Note  Subjective  Doing well Worked with PT this am  No complaints  Ready to go home   ROS As above   Objective   BP 135/78 mmHg  Pulse 68  Temp(Src) 98.7 F (37.1 C) (Oral)  Resp 18  Ht 5\' 9"  (1.753 m)  Wt 61.689 kg (136 lb)  BMI 20.07 kg/m2  SpO2 100%  Intake/Output      06/21 0701 - 06/22 0700 06/22 0701 - 06/23 0700   P.O. 840    I.V. (mL/kg) 802.5 (13)    Total Intake(mL/kg) 1642.5 (26.6)    Urine (mL/kg/hr) 700 (0.5)    Blood     Total Output 700     Net +942.5          Urine Occurrence 2 x      Exam  WUJ:WJXBJYNGen:resting comfortably in bed, NAD Ext:       Left Lower Extremity   Dressing removed  Incision L medial knee looks good, scant bloody drainage  Ext warm  Swelling improved  No other acute changes noted   Tolerating hinged knee brace     Assessment and Plan   POD/HD#:2   - Left bicondylar tibial plateau fracture             ORIF Tibial plateau fracture             Continue with elevation of left leg to keep knee straight             Dressing changes as needed  Ok to shower once surgical wound completely dry   Unrestricted ROM L knee   NWB L leg x 8 weeks              Ice prn             PT/OT eval today             Follow up in office in 10-14 days              - Pain management:             Tylenol             Robaxin             Dilaudid, Oxycodone, Percocet as needed for break through pain  - ABL anemia/Hemodynamics             Stable  - Medical issues                 Marijuana and nicotine dependence                         Absolutely no nicotine products of any kind, including Patches    Vitamin D deficiency and testosterone deficiency   These are likely both related to his marijuana abuse   Fortunately calcitriol levels are normal   Supplement vitamin D for now, hold on testosterone replacement   Patient without insurance coverage so likely unable to get Forteo  - DVT/PE prophylaxis:             SCDs             Lovenox  - Activity:             OOB with PT             NWB 6-8 weeks L leg                -  FEN/GI prophylaxis/Foley/Lines:                         Diet at tolerated   -Ex-fix/Splint care:             Dressing stable   - Impediments to fracture healing:             Marijuana and nicotine use  - Dispo:             dc home today  Follow up with ortho in 2 weeks     Craig LatinKeith W. Yania Bogie, PA-C [facility] [phone](P) [phone] (O) 12/02/2015 11:08 AM

## 2015-12-02 NOTE — Anesthesia Postprocedure Evaluation (Signed)
Anesthesia Post Note  Patient: Craig Bonilla  Procedure(s) Performed: Procedure(s) (LRB): OPEN REDUCTION INTERNAL FIXATION (ORIF) LEFT TIBIAL PLATEAU (Left)  Patient location during evaluation: PACU Anesthesia Type: General Level of consciousness: awake and alert Pain management: pain level controlled Vital Signs Assessment: post-procedure vital signs reviewed and stable Respiratory status: spontaneous breathing, nonlabored ventilation, respiratory function stable and patient connected to nasal cannula oxygen Cardiovascular status: blood pressure returned to baseline and stable Postop Assessment: no signs of nausea or vomiting Anesthetic complications: no    Last Vitals:  Filed Vitals:   12/01/15 2118 12/02/15 0545  BP: 138/92 135/78  Pulse: 69 68  Temp: 37.4 C 37.1 C  Resp: 18 18    Last Pain:  Filed Vitals:   12/02/15 0913  PainSc: 8                  Reino KentJudd, Wilbert Hayashi J
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
## 2019-05-13 ENCOUNTER — Encounter: Payer: Self-pay | Admitting: Emergency Medicine

## 2019-05-13 ENCOUNTER — Emergency Department: Payer: Self-pay

## 2019-05-13 ENCOUNTER — Other Ambulatory Visit: Payer: Self-pay

## 2019-05-13 ENCOUNTER — Inpatient Hospital Stay
Admission: EM | Admit: 2019-05-13 | Discharge: 2019-05-16 | DRG: 440 | Disposition: A | Discharge status: Home / Self Care | Payer: Self-pay | Attending: Family Medicine | Admitting: Family Medicine

## 2019-05-13 DIAGNOSIS — F17210 Nicotine dependence, cigarettes, uncomplicated: Secondary | ICD-10-CM | POA: Diagnosis present

## 2019-05-13 DIAGNOSIS — K859 Acute pancreatitis without necrosis or infection, unspecified: Principal | ICD-10-CM | POA: Diagnosis present

## 2019-05-13 DIAGNOSIS — E291 Testicular hypofunction: Secondary | ICD-10-CM | POA: Diagnosis present

## 2019-05-13 DIAGNOSIS — K760 Fatty (change of) liver, not elsewhere classified: Secondary | ICD-10-CM | POA: Diagnosis present

## 2019-05-13 DIAGNOSIS — Z20828 Contact with and (suspected) exposure to other viral communicable diseases: Secondary | ICD-10-CM | POA: Diagnosis present

## 2019-05-13 DIAGNOSIS — Z79891 Long term (current) use of opiate analgesic: Secondary | ICD-10-CM

## 2019-05-13 DIAGNOSIS — Z7982 Long term (current) use of aspirin: Secondary | ICD-10-CM

## 2019-05-13 DIAGNOSIS — E559 Vitamin D deficiency, unspecified: Secondary | ICD-10-CM | POA: Diagnosis present

## 2019-05-13 DIAGNOSIS — F1210 Cannabis abuse, uncomplicated: Secondary | ICD-10-CM | POA: Diagnosis present

## 2019-05-13 DIAGNOSIS — E860 Dehydration: Secondary | ICD-10-CM | POA: Diagnosis present

## 2019-05-13 DIAGNOSIS — K2920 Alcoholic gastritis without bleeding: Secondary | ICD-10-CM | POA: Diagnosis present

## 2019-05-13 DIAGNOSIS — K852 Alcohol induced acute pancreatitis without necrosis or infection: Secondary | ICD-10-CM

## 2019-05-13 DIAGNOSIS — Z716 Tobacco abuse counseling: Secondary | ICD-10-CM

## 2019-05-13 DIAGNOSIS — F1020 Alcohol dependence, uncomplicated: Secondary | ICD-10-CM | POA: Diagnosis present

## 2019-05-13 DIAGNOSIS — Z7141 Alcohol abuse counseling and surveillance of alcoholic: Secondary | ICD-10-CM

## 2019-05-13 DIAGNOSIS — Z885 Allergy status to narcotic agent status: Secondary | ICD-10-CM

## 2019-05-13 DIAGNOSIS — Z79899 Other long term (current) drug therapy: Secondary | ICD-10-CM

## 2019-05-13 LAB — CBC
HCT: 40.9 % (ref 39.0–52.0)
Hemoglobin: 14.1 g/dL (ref 13.0–17.0)
MCH: 32.9 pg (ref 26.0–34.0)
MCHC: 34.5 g/dL (ref 30.0–36.0)
MCV: 95.6 fL (ref 80.0–100.0)
Platelets: 223 10*3/uL (ref 150–400)
RBC: 4.28 MIL/uL (ref 4.22–5.81)
RDW: 14.7 % (ref 11.5–15.5)
WBC: 20.4 10*3/uL — ABNORMAL HIGH (ref 4.0–10.5)
nRBC: 0 % (ref 0.0–0.2)

## 2019-05-13 LAB — COMPREHENSIVE METABOLIC PANEL
ALT: 121 U/L — ABNORMAL HIGH (ref 0–44)
AST: 180 U/L — ABNORMAL HIGH (ref 15–41)
Albumin: 4.4 g/dL (ref 3.5–5.0)
Alkaline Phosphatase: 52 U/L (ref 38–126)
Anion gap: 21 — ABNORMAL HIGH (ref 5–15)
BUN: 8 mg/dL (ref 6–20)
CO2: 16 mmol/L — ABNORMAL LOW (ref 22–32)
Calcium: 9 mg/dL (ref 8.9–10.3)
Chloride: 102 mmol/L (ref 98–111)
Creatinine, Ser: 0.84 mg/dL (ref 0.61–1.24)
GFR calc Af Amer: >60 mL/min (ref >60)
GFR calc non Af Amer: >60 mL/min (ref >60)
Glucose, Bld: 80 mg/dL (ref 70–99)
Potassium: 3.5 mmol/L (ref 3.5–5.1)
Sodium: 139 mmol/L (ref 135–145)
Total Bilirubin: 0.7 mg/dL (ref 0.3–1.2)
Total Protein: 7.6 g/dL (ref 6.5–8.1)

## 2019-05-13 LAB — LIPASE, BLOOD: Lipase: 4846 U/L — ABNORMAL HIGH (ref 11–51)

## 2019-05-13 MED ORDER — MORPHINE SULFATE (PF) 4 MG/ML IV SOLN
INTRAVENOUS | Status: AC
  Administered 2019-05-14: 4 mg via INTRAVENOUS
  Filled 2019-05-13: qty 1

## 2019-05-13 MED ORDER — IOHEXOL 300 MG/ML  SOLN
100.0000 mL | Freq: Once | INTRAMUSCULAR | Status: AC | PRN
  Administered 2019-05-13: 22:00:00 100 mL via INTRAVENOUS

## 2019-05-13 MED ORDER — FENTANYL CITRATE (PF) 100 MCG/2ML IJ SOLN
100.0000 ug | Freq: Once | INTRAMUSCULAR | Status: AC
  Administered 2019-05-14: 100 ug via INTRAVENOUS
  Filled 2019-05-13: qty 2

## 2019-05-13 MED ORDER — SODIUM CHLORIDE 0.9 % IV SOLN
Freq: Once | INTRAVENOUS | Status: AC
  Administered 2019-05-14: 03:00:00 via INTRAVENOUS

## 2019-05-13 MED ORDER — ONDANSETRON HCL 4 MG/2ML IJ SOLN
4.0000 mg | Freq: Once | INTRAMUSCULAR | Status: AC
  Administered 2019-05-13: 20:00:00 4 mg via INTRAVENOUS
  Filled 2019-05-13: qty 2

## 2019-05-13 MED ORDER — MORPHINE SULFATE (PF) 4 MG/ML IV SOLN
4.0000 mg | INTRAVENOUS | Status: DC | PRN
  Administered 2019-05-13 – 2019-05-14 (×4): 4 mg via INTRAVENOUS
  Filled 2019-05-13 (×4): qty 1

## 2019-05-13 MED ORDER — ONDANSETRON HCL 4 MG/2ML IJ SOLN
4.0000 mg | Freq: Once | INTRAMUSCULAR | Status: AC
  Administered 2019-05-14: 4 mg via INTRAVENOUS
  Filled 2019-05-13: qty 2

## 2019-05-13 MED ORDER — SODIUM CHLORIDE 0.9 % IV BOLUS
1000.0000 mL | Freq: Once | INTRAVENOUS | Status: AC
  Administered 2019-05-13: 1000 mL via INTRAVENOUS

## 2019-05-13 MED ORDER — FENTANYL CITRATE (PF) 100 MCG/2ML IJ SOLN
50.0000 ug | INTRAMUSCULAR | Status: DC | PRN
  Administered 2019-05-13: 20:00:00 50 ug via INTRAVENOUS
  Filled 2019-05-13: qty 2
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
## 2019-05-13 NOTE — ED Triage Notes (Signed)
Pt arrived via EMS from home with reports of abdominal pain that started about 2 hours PTA,  PT c/o N/V. Pt reports the pain is in the lower stomach.  Pt states he did drink 1/2 can beer today, but before the pain started.  Pt moaning in triage, states he wants something for pain to help him go to sleep.  Pt given 4mg  Zofran with EMS.

## 2019-05-14 DIAGNOSIS — K859 Acute pancreatitis without necrosis or infection, unspecified: Secondary | ICD-10-CM | POA: Diagnosis present

## 2019-05-14 LAB — URINALYSIS, COMPLETE (UACMP) WITH MICROSCOPIC
Bacteria, UA: NONE SEEN
Bilirubin Urine: NEGATIVE
Glucose, UA: NEGATIVE mg/dL
Hgb urine dipstick: NEGATIVE
Ketones, ur: 20 mg/dL — AB
Leukocytes,Ua: NEGATIVE
Nitrite: NEGATIVE
Protein, ur: NEGATIVE mg/dL
Specific Gravity, Urine: 1.038 — ABNORMAL HIGH (ref 1.005–1.030)
Squamous Epithelial / LPF: NONE SEEN (ref 0–5)
WBC, UA: NONE SEEN WBC/hpf (ref 0–5)
pH: 5 (ref 5.0–8.0)

## 2019-05-14 LAB — COMPREHENSIVE METABOLIC PANEL
ALT: 86 U/L — ABNORMAL HIGH (ref 0–44)
AST: 95 U/L — ABNORMAL HIGH (ref 15–41)
Albumin: 3.6 g/dL (ref 3.5–5.0)
Alkaline Phosphatase: 41 U/L (ref 38–126)
Anion gap: 10 (ref 5–15)
BUN: 8 mg/dL (ref 6–20)
CO2: 22 mmol/L (ref 22–32)
Calcium: 7.7 mg/dL — ABNORMAL LOW (ref 8.9–10.3)
Chloride: 105 mmol/L (ref 98–111)
Creatinine, Ser: 0.91 mg/dL (ref 0.61–1.24)
GFR calc Af Amer: >60 mL/min (ref >60)
GFR calc non Af Amer: >60 mL/min (ref >60)
Glucose, Bld: 105 mg/dL — ABNORMAL HIGH (ref 70–99)
Potassium: 3.7 mmol/L (ref 3.5–5.1)
Sodium: 137 mmol/L (ref 135–145)
Total Bilirubin: 1 mg/dL (ref 0.3–1.2)
Total Protein: 6.5 g/dL (ref 6.5–8.1)

## 2019-05-14 LAB — SARS CORONAVIRUS 2 (TAT 6-24 HRS): SARS Coronavirus 2: NEGATIVE

## 2019-05-14 LAB — LIPASE, BLOOD: Lipase: 918 U/L — ABNORMAL HIGH (ref 11–51)

## 2019-05-14 LAB — CBC
HCT: 35.6 % — ABNORMAL LOW (ref 39.0–52.0)
Hemoglobin: 12.8 g/dL — ABNORMAL LOW (ref 13.0–17.0)
MCH: 32.8 pg (ref 26.0–34.0)
MCHC: 36 g/dL (ref 30.0–36.0)
MCV: 91.3 fL (ref 80.0–100.0)
Platelets: 177 10*3/uL (ref 150–400)
RBC: 3.9 MIL/uL — ABNORMAL LOW (ref 4.22–5.81)
RDW: 14.6 % (ref 11.5–15.5)
WBC: 11.7 10*3/uL — ABNORMAL HIGH (ref 4.0–10.5)
nRBC: 0 % (ref 0.0–0.2)

## 2019-05-14 LAB — LIPID PANEL
Cholesterol: 177 mg/dL (ref 0–200)
HDL: 98 mg/dL (ref >40)
LDL Cholesterol: 72 mg/dL (ref 0–99)
Total CHOL/HDL Ratio: 1.8 RATIO
Triglycerides: 36 mg/dL (ref <150)
VLDL: 7 mg/dL (ref 0–40)

## 2019-05-14 LAB — LACTATE DEHYDROGENASE: LDH: 175 U/L (ref 98–192)

## 2019-05-14 LAB — HIV ANTIBODY (ROUTINE TESTING W REFLEX): HIV Screen 4th Generation wRfx: NONREACTIVE

## 2019-05-14 MED ORDER — SODIUM CHLORIDE 0.9 % IV SOLN
INTRAVENOUS | Status: AC
  Administered 2019-05-14 (×2): via INTRAVENOUS

## 2019-05-14 MED ORDER — ADULT MULTIVITAMIN W/MINERALS CH
1.0000 | ORAL_TABLET | Freq: Every day | ORAL | Status: DC
  Administered 2019-05-14 – 2019-05-16 (×3): 1 via ORAL
  Filled 2019-05-14 (×3): qty 1

## 2019-05-14 MED ORDER — LORAZEPAM 1 MG PO TABS: 1.0000 mg | ORAL_TABLET | ORAL | Status: DC | PRN

## 2019-05-14 MED ORDER — THIAMINE HCL 100 MG/ML IJ SOLN: 100.0000 mg | Freq: Every day | INTRAMUSCULAR | Status: DC

## 2019-05-14 MED ORDER — DIPHENHYDRAMINE HCL 50 MG/ML IJ SOLN: 25.0000 mg | Freq: Four times a day (QID) | INTRAMUSCULAR | Status: DC | PRN

## 2019-05-14 MED ORDER — HYDROMORPHONE HCL 1 MG/ML IJ SOLN
1.0000 mg | INTRAMUSCULAR | Status: DC | PRN
  Administered 2019-05-14 – 2019-05-16 (×16): 1 mg via INTRAVENOUS
  Filled 2019-05-14 (×17): qty 1

## 2019-05-14 MED ORDER — FOLIC ACID 1 MG PO TABS
1.0000 mg | ORAL_TABLET | Freq: Every day | ORAL | Status: DC
  Administered 2019-05-14 – 2019-05-16 (×3): 1 mg via ORAL
  Filled 2019-05-14 (×3): qty 1

## 2019-05-14 MED ORDER — PANTOPRAZOLE SODIUM 40 MG IV SOLR
40.0000 mg | INTRAVENOUS | Status: DC
  Administered 2019-05-14 – 2019-05-16 (×3): 40 mg via INTRAVENOUS
  Filled 2019-05-14 (×4): qty 40

## 2019-05-14 MED ORDER — VITAMIN B-1 100 MG PO TABS
100.0000 mg | ORAL_TABLET | Freq: Every day | ORAL | Status: DC
  Administered 2019-05-14 – 2019-05-16 (×3): 100 mg via ORAL
  Filled 2019-05-14 (×3): qty 1

## 2019-05-14 MED ORDER — NICOTINE 14 MG/24HR TD PT24
14.0000 mg | MEDICATED_PATCH | Freq: Every day | TRANSDERMAL | Status: DC | PRN
  Filled 2019-05-14: qty 1

## 2019-05-14 MED ORDER — ENOXAPARIN SODIUM 40 MG/0.4ML ~~LOC~~ SOLN
40.0000 mg | SUBCUTANEOUS | Status: DC
  Administered 2019-05-14 – 2019-05-15 (×2): 40 mg via SUBCUTANEOUS
  Filled 2019-05-14 (×3): qty 0.4

## 2019-05-14 MED ORDER — LORAZEPAM 2 MG/ML IJ SOLN: 0.0000 mg | Freq: Two times a day (BID) | INTRAMUSCULAR | Status: DC

## 2019-05-14 MED ORDER — DOCUSATE SODIUM 100 MG PO CAPS
100.0000 mg | ORAL_CAPSULE | Freq: Two times a day (BID) | ORAL | Status: DC
  Administered 2019-05-14 – 2019-05-16 (×4): 100 mg via ORAL
  Filled 2019-05-14 (×4): qty 1

## 2019-05-14 MED ORDER — LORAZEPAM 2 MG/ML IJ SOLN: 1.0000 mg | INTRAMUSCULAR | Status: DC | PRN

## 2019-05-14 MED ORDER — ONDANSETRON HCL 4 MG/2ML IJ SOLN: 4.0000 mg | Freq: Four times a day (QID) | INTRAMUSCULAR | Status: DC | PRN

## 2019-05-14 MED ORDER — LORAZEPAM 2 MG/ML IJ SOLN: 0.0000 mg | Freq: Four times a day (QID) | INTRAMUSCULAR | Status: AC
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
## 2019-05-14 NOTE — Progress Notes (Signed)
History from admission: Craig Bonilla  is a 33 y.o. male,  Vitamin D deficiency, Hypogonadism, Marijuana abuse, tobacco use, etoh abuse, presents with c/o abdomina pain after drinking beer.   In ED,  T 97.7 P 96  R 20, Bp 136/97  Pox 100% on RA Wt 61.2kg  CT abd/ pelvis IMPRESSION: 1. Acute pancreatitis. No abscess or pseudocyst. 2. Fatty liver.  Lipase 4,846   Assessment & Plan:    Active Problems:   Pancreatitis  ETOH pancreatitis Check LDH, check lipid NPO  Hydrate with ns Dilaudid 1mg  iv q3h, prn  Zofran 4mg  iv q6h prn  Benadryl 25mg  iv q6h prn  Colace 100mg  po bid Check cbc, cmp, lipase in am  Abnormal liver function likely related to ETOH Check acute hepatitis panel Check cmp in am   ETOH dependence Pt states doesn't typically have withdrawal  CIWA  Nicotine dep Pt counselled on smoking cessation x 3 minutes Nicotine patch prn   DVT Prophylaxis-   Lovenox - SCDs    Chart reviewed.  Continue treatment and plan as mentioned above on admission. This decreased down below 900 from above 4000 formation N.p.o. for now Pain management Dehydration  LFT also improving Continue to monitor

## 2019-05-14 NOTE — Progress Notes (Signed)
Report called to [facility] for pt. being transferred  to room 213.

## 2019-05-15 DIAGNOSIS — K852 Alcohol induced acute pancreatitis without necrosis or infection: Secondary | ICD-10-CM

## 2019-05-15 LAB — COMPREHENSIVE METABOLIC PANEL
ALT: 54 U/L — ABNORMAL HIGH (ref 0–44)
AST: 50 U/L — ABNORMAL HIGH (ref 15–41)
Albumin: 3.4 g/dL — ABNORMAL LOW (ref 3.5–5.0)
Alkaline Phosphatase: 40 U/L (ref 38–126)
Anion gap: 11 (ref 5–15)
BUN: 5 mg/dL — ABNORMAL LOW (ref 6–20)
CO2: 21 mmol/L — ABNORMAL LOW (ref 22–32)
Calcium: 7.7 mg/dL — ABNORMAL LOW (ref 8.9–10.3)
Chloride: 100 mmol/L (ref 98–111)
Creatinine, Ser: 0.94 mg/dL (ref 0.61–1.24)
GFR calc Af Amer: >60 mL/min (ref >60)
GFR calc non Af Amer: >60 mL/min (ref >60)
Glucose, Bld: 105 mg/dL — ABNORMAL HIGH (ref 70–99)
Potassium: 3.3 mmol/L — ABNORMAL LOW (ref 3.5–5.1)
Sodium: 132 mmol/L — ABNORMAL LOW (ref 135–145)
Total Bilirubin: 1.7 mg/dL — ABNORMAL HIGH (ref 0.3–1.2)
Total Protein: 6.5 g/dL (ref 6.5–8.1)

## 2019-05-15 LAB — LIPASE, BLOOD: Lipase: 230 U/L — ABNORMAL HIGH (ref 11–51)

## 2019-05-15 MED ORDER — POTASSIUM CHLORIDE IN NACL 20-0.9 MEQ/L-% IV SOLN
INTRAVENOUS | Status: DC
  Administered 2019-05-15: 18:00:00 via INTRAVENOUS
  Filled 2019-05-15 (×3): qty 1000

## 2019-05-15 MED ORDER — SODIUM CHLORIDE 0.9 % IV SOLN
INTRAVENOUS | Status: DC
  Administered 2019-05-15 (×2): via INTRAVENOUS

## 2019-05-15 NOTE — Progress Notes (Signed)
Brief narrative: History from admission: Craig Bonilla a33 y.o.male,Vitamin D deficiency, Hypogonadism, Marijuana abuse, tobacco use, etoh abuse, presents with c/o abdomina pain after drinking beer.  In ED,T 97.7 P 96 R 20, Bp 136/97 Pox 100% on RA, Wt 61.2kg CT abd/ pelvis: IMPRESSION:1. Acute pancreatitis. No abscess or pseudocyst.2. Fatty liver.  Subjective: Pain is improved significantly even though intermittent pain is still there this morning.  Wanted to eat something.  Has not had a bowel movement for couple of days.  Objective: Vital signs in last 24 hours: Temp:  [99.7 F (37.6 C)-100.5 F (38.1 C)] 99.8 F (37.7 C) (12/03 1220) Pulse Rate:  [83-100] 91 (12/03 1220) Resp:  [17-20] 20 (12/03 0454) BP: (121-130)/(80-86) 124/81 (12/03 1220) SpO2:  [98 %-100 %] 99 % (12/03 1220)  Intake/Output from previous day: 12/02 0701 - 12/03 0700 In: 1000 [I.V.:1000] Out: 200 [Urine:200] Intake/Output this shift: Total I/O In: 775.6 [P.O.:240; I.V.:535.6] Out: 1300 [Urine:1300]  1.  General: axoxo3  2. Psychiatric: euthymic  3. Neurologic: nonfocal  4. HEENMT:  Anicteric, pupils 1.52mm symmetric, direct, consensual intact Neck: no jvd  5. Respiratory : CTAB  6. Cardiovascular : rrr s1, s2, no m/g/r  7. Gastrointestinal:  Abd: soft,nd, +bs; + tenderness epigastric mild  8. Skin:  Ext: no c/c/e,  + tatoo  9.Musculoskeletal:  Good ROM  Results for orders placed or performed during the hospital encounter of 05/13/19 (from the past 24 hour(s))  Comprehensive metabolic panel     Status: Abnormal   Collection Time: 05/15/19  8:38 AM  Result Value Ref Range   Sodium 132 (L) 135 - 145 mmol/L   Potassium 3.3 (L) 3.5 - 5.1 mmol/L   Chloride 100 98 - 111 mmol/L   CO2 21 (L) 22 - 32 mmol/L   Glucose, Bld 105 (H) 70 - 99 mg/dL   BUN 5 (L) 6 - 20 mg/dL   Creatinine, Ser 5.27 0.61 - 1.24 mg/dL   Calcium 7.7 (L) 8.9 - 10.3 mg/dL   Total Protein 6.5  6.5 - 8.1 g/dL   Albumin 3.4 (L) 3.5 - 5.0 g/dL   AST 50 (H) 15 - 41 U/L   ALT 54 (H) 0 - 44 U/L   Alkaline Phosphatase 40 38 - 126 U/L   Total Bilirubin 1.7 (H) 0.3 - 1.2 mg/dL   GFR calc non Af Amer >60 >60 mL/min   GFR calc Af Amer >60 >60 mL/min   Anion gap 11 5 - 15  Lipase, blood     Status: Abnormal   Collection Time: 05/15/19  8:38 AM  Result Value Ref Range   Lipase 230 (H) 11 - 51 U/L    Studies/Results: Ct Abdomen Pelvis W Contrast  Result Date: 05/13/2019 CLINICAL DATA:  33 year old male with abdominal pain. EXAM: CT ABDOMEN AND PELVIS WITH CONTRAST TECHNIQUE: Multidetector CT imaging of the abdomen and pelvis was performed using the standard protocol following bolus administration of intravenous contrast. CONTRAST:  OMNIPAQUE IOHEXOL 300 MG/ML  SOLN COMPARISON:  CT of the chest abdomen pelvis dated 11/14/2015. FINDINGS: Lower chest: The visualized lung bases are clear. No intra-abdominal free air. There is small to moderate inflammatory fluid in the upper abdomen. Hepatobiliary: Diffuse fatty infiltration of the liver. No intrahepatic biliary ductal dilatation. The gallbladder is unremarkable. Pancreas: There is inflammatory changes of the pancreas consistent with acute pancreatitis. Correlation with pancreatic enzymes recommended. No drainable fluid collection/abscess or pseudocyst. Spleen: Normal in size without focal abnormality. Adrenals/Urinary Tract: The adrenal glands are  unremarkable. The kidneys, visualized ureters, and urinary bladder appear unremarkable. Stomach/Bowel: Mild diffuse thickened appearance of the colon likely related to underdistention. There is no bowel obstruction. The appendix is normal. Vascular/Lymphatic: The abdominal aorta and IVC are unremarkable. The SMV, splenic vein, and main portal vein are patent. No portal venous gas. There is no adenopathy. Reproductive: The prostate and seminal vesicles are grossly unremarkable. Other: None Musculoskeletal:  No acute or significant osseous findings. IMPRESSION: 1. Acute pancreatitis. No abscess or pseudocyst. 2. Fatty liver. Electronically Signed   By: Anner Crete M.D.   On: 05/13/2019 21:58    Scheduled Meds: . docusate sodium  100 mg Oral BID  . enoxaparin (LOVENOX) injection  40 mg Subcutaneous Q24H  . folic acid  1 mg Oral Daily  . LORazepam  0-4 mg Intravenous Q6H   Followed by  . [START ON 05/16/2019] LORazepam  0-4 mg Intravenous Q12H  . multivitamin with minerals  1 tablet Oral Daily  . pantoprazole (PROTONIX) IV  40 mg Intravenous Q24H  . thiamine  100 mg Oral Daily   Or  . thiamine  100 mg Intravenous Daily   Continuous Infusions: . 0.9 % NaCl with KCl 20 mEq / L     PRN Meds:diphenhydrAMINE, HYDROmorphone (DILAUDID) injection, LORazepam **OR** LORazepam, morphine injection, nicotine, ondansetron (ZOFRAN) IV  Assessment/Plan: Active Problems: Pancreatitis  ETOH pancreatitis -Lipase in 230 now from 4000 and more on admission -Abdominal pain much improved -Was n.p.o.: May start clear liquid diet and advance as tolerated Hydrate with ns Dilaudid 1mg  iv q3h, prn  Zofran 4mg  iv q6h prn  Benadryl 25mg  iv q6h prn  Colace 100mg  po bid LFTs have improved from admission as well -Lipid panel is essentially normal  Abnormal liver function likely related to ETOH -Likely from alcohol ingestion -But improved significantly 24 hours after admission  ETOH dependence Pt states doesn't typically have withdrawal  CIWA; scoring low -Counseled on alcohol cessation patient expressed understanding  Nicotine dep Pt counselled on smoking cessation x 3 minutes Nicotine patch prn   DVT Prophylaxis- Lovenox - SCDs      LOS: 1 day   Craig Bonilla

## 2019-05-15 NOTE — Consult Note (Signed)
[place] for Electrolyte Monitoring and Replacement   Recent Labs: Potassium (mmol/L)  Date Value  05/15/2019 3.3 (L)   Magnesium (mg/dL)  Date Value  11/17/2015 1.6 (L)   Calcium (mg/dL)  Date Value  05/15/2019 7.7 (L)   Calcium, Total (PTH) (mg/dL)  Date Value  11/17/2015 8.9   Albumin (g/dL)  Date Value  05/15/2019 3.4 (L)   Phosphorus (mg/dL)  Date Value  11/17/2015 3.6   Sodium (mmol/L)  Date Value  05/15/2019 132 (L)   Corrected Ca:  8.2 mg/dL  Assessment: 33 y.o. male,  Vitamin D deficiency, Hypogonadism, Marijuana abuse, tobacco use, etoh abuse, presents EtOh induced pancreatitis. He is currently receiving IVNS at 150 mL/hr  Goal of Therapy:  Electrolytes WNL  Plan:   Add 20 mEq KCl/L to IVNS and continue at 150 mL/hr  Given high risk of re-feeding syndrome follow BMP, magnesium and phosphorous  F/U in am  Dallie Piles ,PharmD Clinical Pharmacist 05/15/2019 2:41 PM

## 2019-05-16 LAB — COMPREHENSIVE METABOLIC PANEL
ALT: 41 U/L (ref 0–44)
AST: 38 U/L (ref 15–41)
Albumin: 3.1 g/dL — ABNORMAL LOW (ref 3.5–5.0)
Alkaline Phosphatase: 36 U/L — ABNORMAL LOW (ref 38–126)
Anion gap: 7 (ref 5–15)
BUN: <5 mg/dL — ABNORMAL LOW (ref 6–20)
CO2: 23 mmol/L (ref 22–32)
Calcium: 7.7 mg/dL — ABNORMAL LOW (ref 8.9–10.3)
Chloride: 106 mmol/L (ref 98–111)
Creatinine, Ser: 0.96 mg/dL (ref 0.61–1.24)
GFR calc Af Amer: >60 mL/min (ref >60)
GFR calc non Af Amer: >60 mL/min (ref >60)
Glucose, Bld: 106 mg/dL — ABNORMAL HIGH (ref 70–99)
Potassium: 3.5 mmol/L (ref 3.5–5.1)
Sodium: 136 mmol/L (ref 135–145)
Total Bilirubin: 1.6 mg/dL — ABNORMAL HIGH (ref 0.3–1.2)
Total Protein: 6.4 g/dL — ABNORMAL LOW (ref 6.5–8.1)

## 2019-05-16 LAB — LIPASE, BLOOD: Lipase: 105 U/L — ABNORMAL HIGH (ref 11–51)

## 2019-05-16 LAB — RENAL FUNCTION PANEL
Albumin: 3 g/dL — ABNORMAL LOW (ref 3.5–5.0)
Anion gap: 7 (ref 5–15)
BUN: <5 mg/dL — ABNORMAL LOW (ref 6–20)
CO2: 23 mmol/L (ref 22–32)
Calcium: 7.6 mg/dL — ABNORMAL LOW (ref 8.9–10.3)
Chloride: 104 mmol/L (ref 98–111)
Creatinine, Ser: 1 mg/dL (ref 0.61–1.24)
GFR calc Af Amer: >60 mL/min (ref >60)
GFR calc non Af Amer: >60 mL/min (ref >60)
Glucose, Bld: 103 mg/dL — ABNORMAL HIGH (ref 70–99)
Phosphorus: 1.1 mg/dL — ABNORMAL LOW (ref 2.5–4.6)
Potassium: 3.5 mmol/L (ref 3.5–5.1)
Sodium: 134 mmol/L — ABNORMAL LOW (ref 135–145)

## 2019-05-16 LAB — MAGNESIUM: Magnesium: 1.8 mg/dL (ref 1.7–2.4)

## 2019-05-16 MED ORDER — ADULT MULTIVITAMIN W/MINERALS CH: 1.0000 | ORAL_TABLET | Freq: Every day | ORAL | 0 refills | Status: DC

## 2019-05-16 MED ORDER — POTASSIUM PHOSPHATES 15 MMOLE/5ML IV SOLN
30.0000 mmol | Freq: Once | INTRAVENOUS | Status: AC
  Administered 2019-05-16: 11:00:00 30 mmol via INTRAVENOUS
  Filled 2019-05-16: qty 10

## 2019-05-16 MED ORDER — NICOTINE 14 MG/24HR TD PT24: 14.0000 mg | MEDICATED_PATCH | Freq: Every day | TRANSDERMAL | 0 refills | Status: DC | PRN

## 2019-05-16 MED ORDER — SODIUM CHLORIDE 0.9 % IV SOLN
INTRAVENOUS | Status: DC
  Administered 2019-05-16: 09:00:00 via INTRAVENOUS

## 2019-05-16 MED ORDER — FOLIC ACID 1 MG PO TABS: 1.0000 mg | ORAL_TABLET | Freq: Every day | ORAL | 0 refills | Status: AC

## 2019-05-16 MED ORDER — ACETAMINOPHEN 325 MG PO TABS
650.0000 mg | ORAL_TABLET | Freq: Four times a day (QID) | ORAL | Status: DC | PRN
  Administered 2019-05-16: 06:00:00 650 mg via ORAL
  Filled 2019-05-16: qty 2

## 2019-05-16 MED ORDER — FOLIC ACID 1 MG PO TABS: 1.0000 mg | ORAL_TABLET | Freq: Every day | ORAL | 0 refills | Status: DC

## 2019-05-16 MED ORDER — THIAMINE HCL 100 MG PO TABS: 100.0000 mg | ORAL_TABLET | Freq: Every day | ORAL | 0 refills | Status: DC

## 2019-05-16 MED ORDER — ADULT MULTIVITAMIN W/MINERALS CH: 1.0000 | ORAL_TABLET | Freq: Every day | ORAL | 0 refills | Status: AC

## 2019-05-16 MED ORDER — THIAMINE HCL 100 MG PO TABS: 100.0000 mg | ORAL_TABLET | Freq: Every day | ORAL | 0 refills | Status: AC

## 2019-05-16 MED ORDER — PANTOPRAZOLE SODIUM 20 MG PO TBEC: 20.0000 mg | DELAYED_RELEASE_TABLET | Freq: Every day | ORAL | 0 refills | Status: AC

## 2019-05-16 MED ORDER — SODIUM PHOSPHATES 45 MMOLE/15ML IV SOLN
10.0000 mmol | Freq: Once | INTRAVENOUS | Status: DC
  Administered 2019-05-16: 10 mmol via INTRAVENOUS
  Filled 2019-05-16: qty 3.33

## 2019-05-16 MED ORDER — PANTOPRAZOLE SODIUM 20 MG PO TBEC: 20.0000 mg | DELAYED_RELEASE_TABLET | Freq: Every day | ORAL | 0 refills | Status: DC

## 2019-05-16 MED ORDER — ACETAMINOPHEN 325 MG PO TABS: 325.0000 mg | ORAL_TABLET | Freq: Four times a day (QID) | ORAL | 0 refills | Status: DC | PRN

## 2019-05-16 MED ORDER — SODIUM PHOSPHATES 45 MMOLE/15ML IV SOLN
30.0000 mmol | Freq: Once | INTRAVENOUS | Status: DC
  Filled 2019-05-16: qty 10

## 2019-05-16 MED ORDER — NICOTINE 14 MG/24HR TD PT24: 14.0000 mg | MEDICATED_PATCH | Freq: Every day | TRANSDERMAL | 0 refills | Status: AC | PRN

## 2019-05-16 MED ORDER — ACETAMINOPHEN 325 MG PO TABS: 325.0000 mg | ORAL_TABLET | Freq: Four times a day (QID) | ORAL | 0 refills | Status: AC | PRN

## 2019-05-16 NOTE — Consult Note (Signed)
[place] for Electrolyte Monitoring and Replacement   Recent Labs: Potassium (mmol/L)  Date Value  05/16/2019 3.5  05/16/2019 3.5   Magnesium (mg/dL)  Date Value  05/16/2019 1.8   Calcium (mg/dL)  Date Value  05/16/2019 7.6 (L)  05/16/2019 7.7 (L)   Calcium, Total (PTH) (mg/dL)  Date Value  11/17/2015 8.9   Albumin (g/dL)  Date Value  05/16/2019 3.0 (L)  05/16/2019 3.1 (L)   Phosphorus (mg/dL)  Date Value  05/16/2019 1.1 (L)   Sodium (mmol/L)  Date Value  05/16/2019 134 (L)  05/16/2019 136   Corrected Ca:  8.5 mg/dL  Assessment: 33 y.o. male,  Vitamin D deficiency, Hypogonadism, Marijuana abuse, tobacco use, EtOH abuse, presents EtOH induced pancreatitis.   Goal of Therapy:  Electrolytes WNL  Plan:   remove 20 mEq KCl/L from IVNS and continue at 100 mL/hr  Potassium phosphate 30 mmol IV x 1; this provides 44 mEq potassium  Given high risk of re-feeding syndrome follow potassium, magnesium and phosphorous daily until electrolyte levels are stable  F/U in am  Dallie Piles ,PharmD Clinical Pharmacist 05/16/2019 7:47 AM

## 2019-05-16 NOTE — Discharge Instructions (Signed)
Acute Pancreatitis ° °Acute pancreatitis happens when the pancreas gets swollen. The pancreas is a large gland in the body that helps to control blood sugar. It also makes enzymes that help to digest food. °This condition can last a few days and cause serious problems. The lungs, heart, and kidneys may stop working. °What are the causes? °Causes include: °· Alcohol abuse. °· Drug abuse. °· Gallstones. °· A tumor in the pancreas. °Other causes include: °· Some medicines. °· Some chemicals. °· Diabetes. °· An infection. °· Damage caused by an accident. °· The poison (venom) from a scorpion bite. °· Belly (abdominal) surgery. °· The body's defense system (immune system) attacking the pancreas (autoimmune pancreatitis). °· Genes that are passed from parent to child (inherited). °In some cases, the cause is not known. °What are the signs or symptoms? °· Pain in the upper belly that may be felt in the back. The pain may be very bad. °· Swelling of the belly. °· Feeling sick to your stomach (nauseous) and throwing up (vomiting). °· Fever. °How is this treated? °You will likely have to stay in the hospital. Treatment may include: °· Pain medicine. °· Fluid through an IV tube. °· Placing a tube in the stomach to take out the stomach contents. This may help you stop throwing up. °· Not eating for 3-4 days. °· Antibiotic medicines, if you have an infection. °· Treating any other problems that may be the cause. °· Steroid medicines, if your problem is caused by your defense system attacking your body's own tissues. °· Surgery. °Follow these instructions at home: °Eating and drinking ° °· Follow instructions from your doctor about what to eat and drink. °· Eat foods that do not have a lot of fat in them. °· Eat small meals often. Do not eat big meals. °· Drink enough fluid to keep your pee (urine) pale yellow. °· Do not drink alcohol if it caused your condition. °Medicines °· Take over-the-counter and prescription medicines only  as told by your doctor. °· Ask your doctor if the medicine prescribed to you: °? Requires you to avoid driving or using heavy machinery. °? Can cause trouble pooping (constipation). You may need to take steps to prevent or treat trouble pooping: °§ Take over-the-counter or prescription medicines. °§ Eat foods that are high in fiber. These include beans, whole grains, and fresh fruits and vegetables. °§ Limit foods that are high in fat and sugar. These include fried or sweet foods. °General instructions °· Do not use any products that contain nicotine or tobacco, such as cigarettes, e-cigarettes, and chewing tobacco. If you need help quitting, ask your doctor. °· Get plenty of rest. °· Check your blood sugar at home as told by your doctor. °· Keep all follow-up visits as told by your doctor. This is important. °Contact a doctor if: °· You do not get better as quickly as expected. °· You have new symptoms. °· Your symptoms get worse. °· You have pain or weakness that lasts a long time. °· You keep feeling sick to your stomach. °· You get better and then you have pain again. °· You have a fever. °Get help right away if: °· You cannot eat or keep fluids down. °· Your pain gets very bad. °· Your skin or the white part of your eyes turns yellow. °· You have sudden swelling in your belly. °· You throw up. °· You feel dizzy or you pass out (faint). °· Your blood sugar is high (over 300   mg/dL). °Summary °· Acute pancreatitis happens when the pancreas gets swollen. °· This condition is often caused by alcohol abuse, drug abuse, or gallstones. °· You will likely have to stay in the hospital for treatment. °This information is not intended to replace advice given to you by your health care provider. Make sure you discuss any questions you have with your health care provider. °Document Released: 11/15/2007 Document Revised: 03/18/2018 Document Reviewed: 03/18/2018 °Elsevier Patient Education © 2020 [employer] ° °

## 2019-05-16 NOTE — Progress Notes (Signed)
Discharge instructions reviewed with patient including followup visits and new medications.  Understanding was verbalized and all questions were answered.  IV removed without complication; patient tolerated well.  Patient discharged home ambulatory in stable condition. 
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
## 2019-05-16 NOTE — Discharge Summary (Signed)
Physician Discharge Summary  Patient ID: Craig Bonilla MRN: [id number] DOB/AGE: 34/01/1986 34 y.o.  Admit date: 05/13/2019 Discharge date: 05/16/2019  Admission Diagnoses: ETOH pancreatitis  Discharge Diagnoses:  Active Problems:   Pancreatitis   Discharged Condition:  [place] Hospital Course:  History from admission:[name],Vitamin D deficiency, Hypogonadism, Marijuana abuse, tobacco use, etoh abuse, presents with c/o abdomina pain after drinking beer.  CT abd/ pelvis: IMPRESSION:1. Acute pancreatitis. No abscess or pseudocyst.2. Fatty liver.  ETOH pancreatitis -Lipaseon d/c 100 from 4000 and more on admission -Abdominal pain resolved -Tolerated diet well  - adequate pain management  LFTs normal from admission as well -Lipid panel is essentially normal - Counseled repeatedly on alcohol cessation  - Advised to cont low fat low cholesterol diet/ soft  - F/U PCP in 1-2 weeks  - Warning S/S explained when to seek immediate medical attention - PPI rx given for possible alcoholic gastritis    Abnormal liver function likely related to ETOH -Improved significantly 24 hours after admission  ETOH dependence Pt states didn't have any withdrawal  CIWA; scoring low -Counseled on alcohol cessation patient expressed understanding - Rx's for b12, folate, thiamine, MV's given   Nicotine dep Pt counselled on smoking cessation  Repeatedly  Nicotine patch rx given   Consults: n/a  Significant Diagnostic Studies: CT-ab/pelvis and others   Treatments: as hospital course and d/c med list   Discharge Exam: Blood pressure 119/83, pulse 96, temperature 98.1 F (36.7 C), temperature source Oral, resp. rate 16, height 5\' 10"  (1.778 m), weight 61.2 kg, SpO2 99 %.   1. General: axoxo3  2. Psychiatric: euthymic  3. Neurologic: nonfocal  4. HEENMT: Anicteric, pupils 1.25mm symmetric, direct, consensual intact Neck: no jvd  5.  Respiratory : CTAB  6. Cardiovascular : rrr s1, s2, no m/g/r  7. Gastrointestinal: Abd: soft,nd, +bs;  Non-tender mostly   8. Skin: Ext: no c/c/e, + tatoo  9.Musculoskeletal: Good ROM  Disposition: Home. PCP follow up in 1-2 weeks. Nursing staffs/ SW/CM to provide resources and or appt with a new PCP.   Discharge Instructions    Call MD for:  difficulty breathing, headache or visual disturbances   Complete by: As directed    Call MD for:  redness, tenderness, or signs of infection (pain, swelling, redness, odor or green/yellow discharge around incision site)   Complete by: As directed    Diet Carb Modified   Complete by: As directed    Low fat/low cholesterol soft diet for 7-10 days.  Drink plenty of fluid.   Discharge instructions   Complete by: As directed    Alcohol Use/ Withdrawal Pancreatitis   Increase activity slowly   Complete by: As directed      Allergies as of 05/16/2019      Reactions   Vicodin [hydrocodone-acetaminophen] Itching      Medication List    TAKE these medications   acetaminophen 325 MG tablet Commonly known as: TYLENOL Take 1 tablet (325 mg total) by mouth every 6 (six) hours as needed for mild pain or headache.   docusate sodium 100 MG capsule Commonly known as: COLACE Take 1 capsule (100 mg total) by mouth 2 (two) times daily.   folic acid 1 MG tablet Commonly known as: FOLVITE Take 1 tablet (1 mg total) by mouth daily. Start taking on: May 17, 2019   multivitamin with minerals Tabs tablet Take 1 tablet by mouth daily. Start taking on: May 17, 2019   nicotine 14 mg/24hr patch  Commonly known as: NICODERM CQ - dosed in mg/24 hours Place 1 patch (14 mg total) onto the skin daily as needed (smoking craving).   pantoprazole 20 MG tablet Commonly known as: Protonix Take 1 tablet (20 mg total) by mouth daily.   thiamine 100 MG tablet Take 1 tablet (100 mg total) by mouth daily. Start taking on: May 17, 2019         Signed: Thomasenia Bottoms 05/16/2019, 8:14 PM

## 2020-05-13 IMAGING — CT CT ABD-PELV W/ CM
2 of 4 series · 16 of 46 positions shown, 18 images · IV contrast (APPLIED)
Comparison: CT of the chest abdomen pelvis dated 11/14/2015.

CLINICAL DATA: 33-year-old male with abdominal pain.

EXAM:
CT ABDOMEN AND PELVIS WITH CONTRAST
TECHNIQUE: Multidetector CT imaging of the abdomen and pelvis was performed
using the standard protocol following bolus administration of
intravenous contrast.
CONTRAST:  100mL OMNIPAQUE IOHEXOL 300 MG/ML  SOLN

[Series 2: routine abd/pel with · axial · 0.71mm/px · z∈[-968,-588]mm · 13 of 84 slices shown, 15 images]
[im 4/84  soft-tissue]
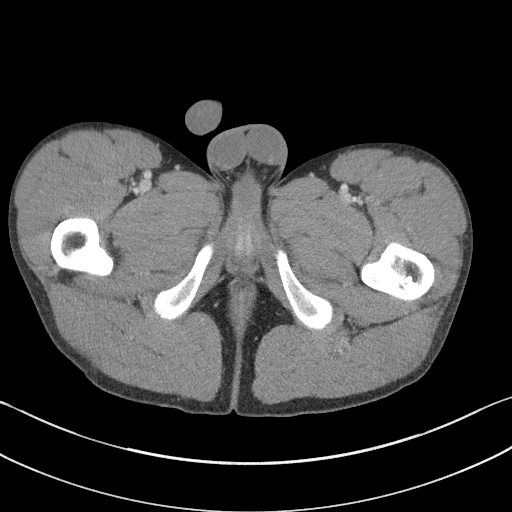
[im 4/84  bone]
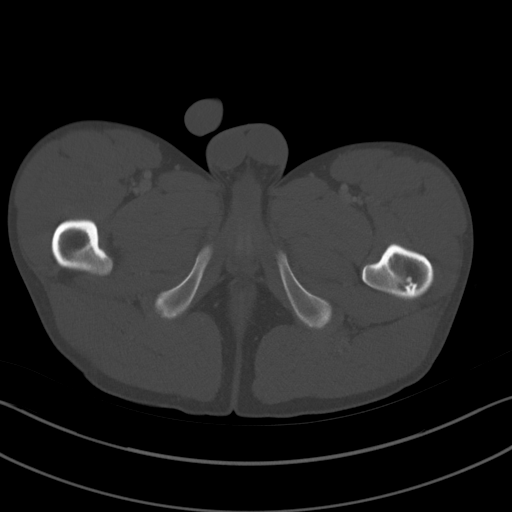
[im 10/84  soft-tissue]
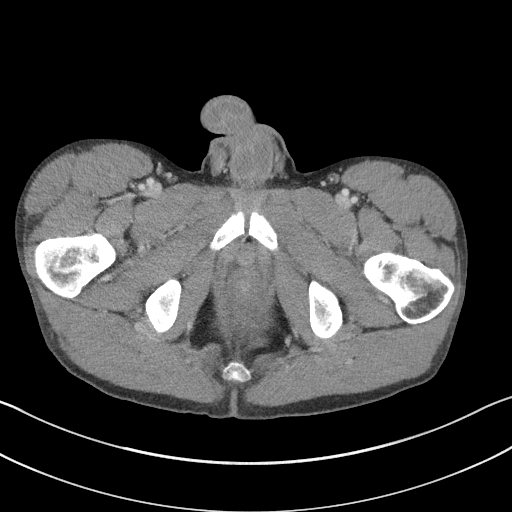
[im 17/84  soft-tissue]
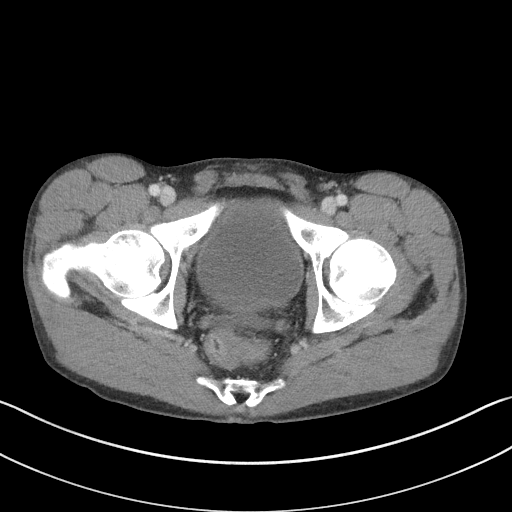
[im 24/84  soft-tissue]
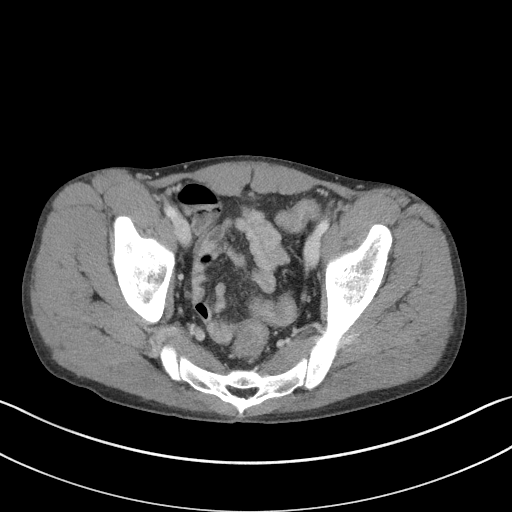
[im 30/84  soft-tissue]
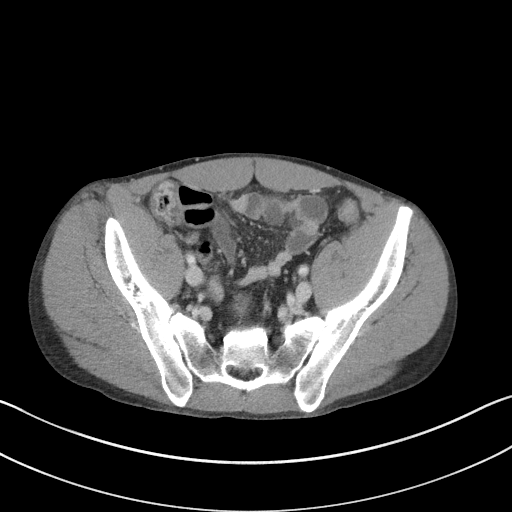
[im 37/84  soft-tissue]
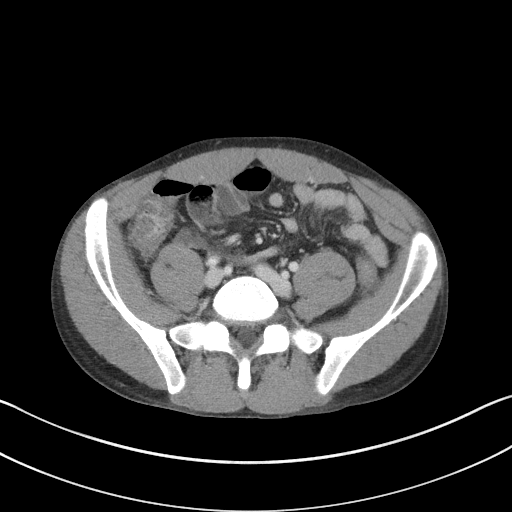
[im 44/84  soft-tissue]
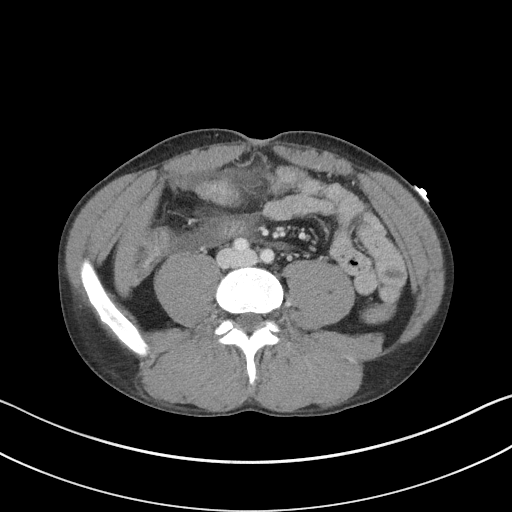
[im 47/84  soft-tissue]
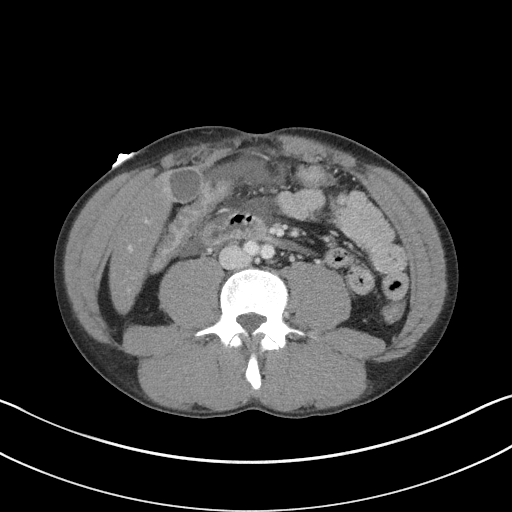
[im 54/84  soft-tissue]
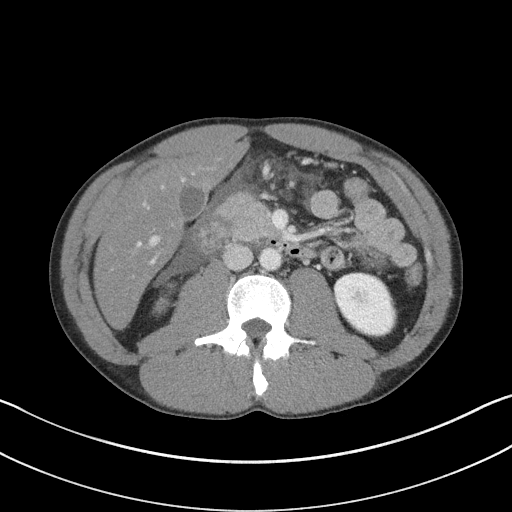
[im 54/84  bone]
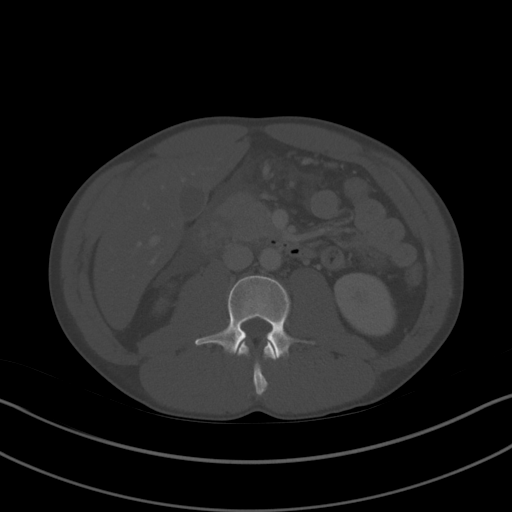
[im 60/84  soft-tissue]
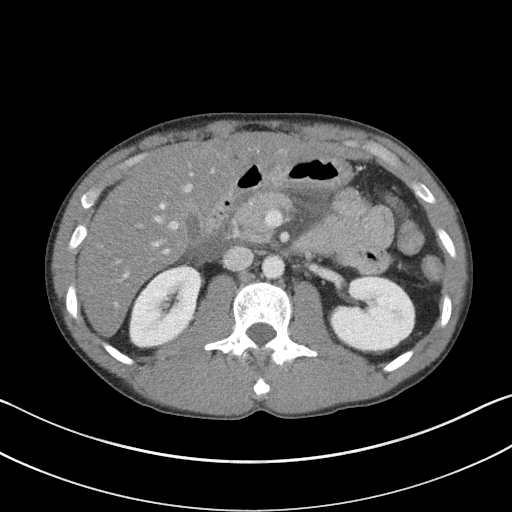
[im 67/84  soft-tissue]
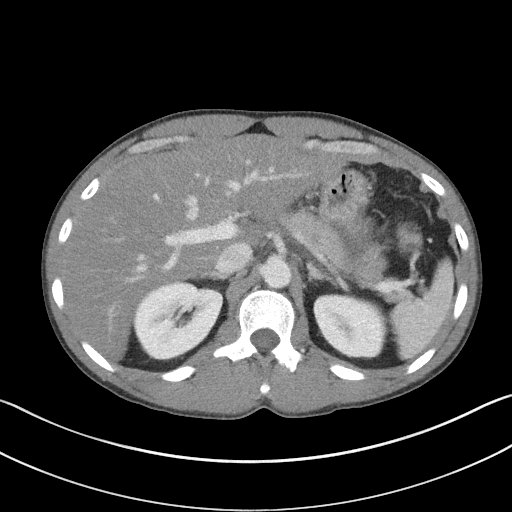
[im 74/84  soft-tissue]
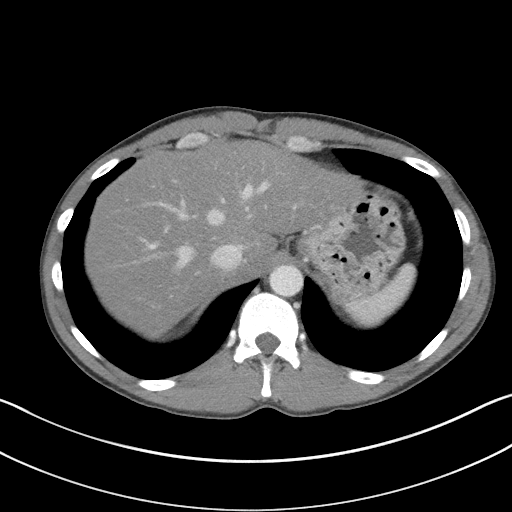
[im 80/84  soft-tissue]
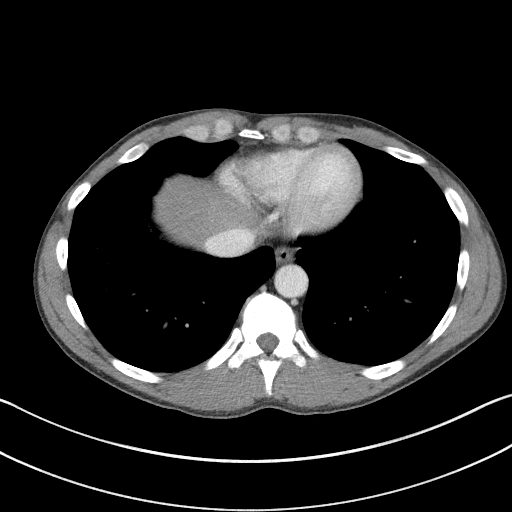

[Series 5: coronal st · coronal · 0.67mm/px · 3 of 70 slices shown]
[im 24/70  soft-tissue]
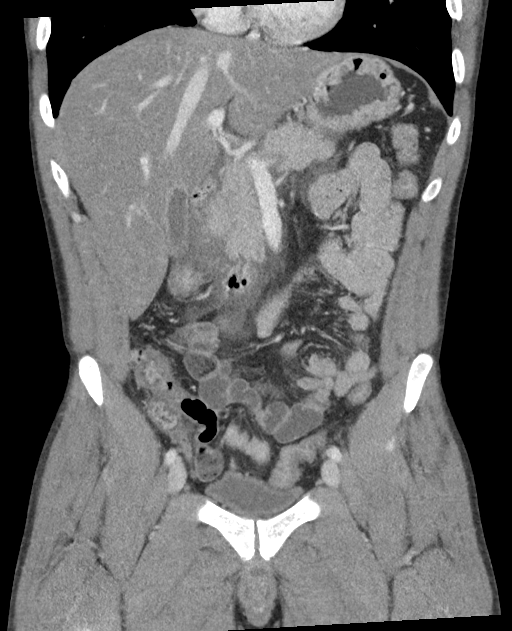
[im 31/70  soft-tissue]
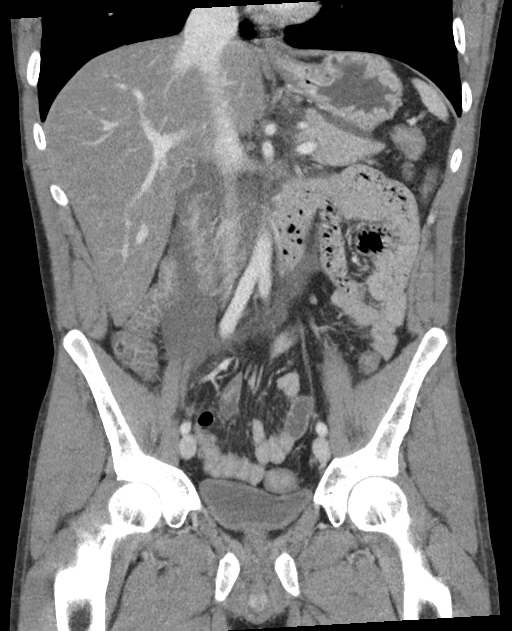
[im 39/70  soft-tissue]
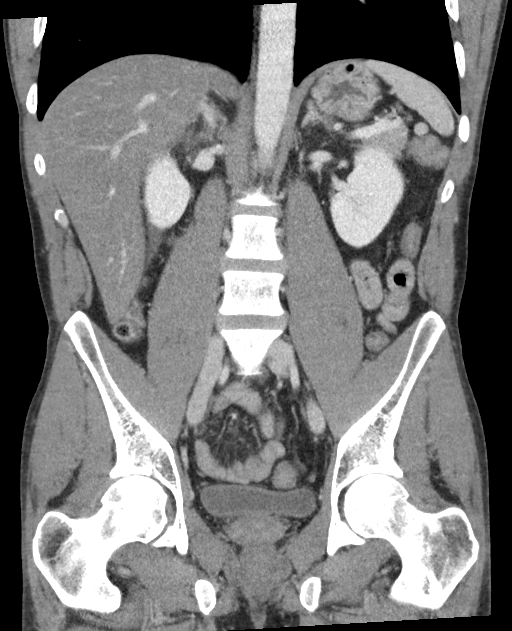

[16 of 46 positions shown; findings below may reference images not displayed]

FINDINGS: Lower chest: The visualized lung bases are clear.

No intra-abdominal free air. There is small to moderate inflammatory
fluid in the upper abdomen.

Hepatobiliary: Diffuse fatty infiltration of the liver. No
intrahepatic biliary ductal dilatation. The gallbladder is
unremarkable.

Pancreas: There is inflammatory changes of the pancreas consistent
with acute pancreatitis. Correlation with pancreatic enzymes
recommended. No drainable fluid collection/abscess or pseudocyst.

Spleen: Normal in size without focal abnormality.

Adrenals/Urinary Tract: The adrenal glands are unremarkable. The
kidneys, visualized ureters, and urinary bladder appear
unremarkable.

Stomach/Bowel: Mild diffuse thickened appearance of the colon likely
related to underdistention. There is no bowel obstruction. The
appendix is normal.

Vascular/Lymphatic: The abdominal aorta and IVC are unremarkable.
The SMV, splenic vein, and main portal vein are patent. No portal
venous gas. There is no adenopathy.

Reproductive: The prostate and seminal vesicles are grossly
unremarkable.

Other: None

Musculoskeletal: No acute or significant osseous findings.
IMPRESSION: 1. Acute pancreatitis. No abscess or pseudocyst.
2. Fatty liver.

## 2022-03-18 ENCOUNTER — Emergency Department
Admission: EM | Admit: 2022-03-18 | Discharge: 2022-03-18 | Disposition: A | Discharge status: Home / Self Care | Payer: Self-pay | Attending: Emergency Medicine | Admitting: Emergency Medicine

## 2022-03-18 ENCOUNTER — Emergency Department: Payer: Self-pay

## 2022-03-18 ENCOUNTER — Other Ambulatory Visit: Payer: Self-pay

## 2022-03-18 DIAGNOSIS — F1010 Alcohol abuse, uncomplicated: Secondary | ICD-10-CM | POA: Insufficient documentation

## 2022-03-18 DIAGNOSIS — Y902 Blood alcohol level of 40-59 mg/100 ml: Secondary | ICD-10-CM | POA: Insufficient documentation

## 2022-03-18 DIAGNOSIS — T50901A Poisoning by unspecified drugs, medicaments and biological substances, accidental (unintentional), initial encounter: Secondary | ICD-10-CM | POA: Insufficient documentation

## 2022-03-18 DIAGNOSIS — F1910 Other psychoactive substance abuse, uncomplicated: Secondary | ICD-10-CM

## 2022-03-18 DIAGNOSIS — F1210 Cannabis abuse, uncomplicated: Secondary | ICD-10-CM | POA: Insufficient documentation

## 2022-03-18 LAB — COMPREHENSIVE METABOLIC PANEL
ALT: 22 U/L (ref 0–44)
AST: 55 U/L — ABNORMAL HIGH (ref 15–41)
Albumin: 4 g/dL (ref 3.5–5.0)
Alkaline Phosphatase: 49 U/L (ref 38–126)
Anion gap: 15 (ref 5–15)
BUN: 12 mg/dL (ref 6–20)
CO2: 21 mmol/L — ABNORMAL LOW (ref 22–32)
Calcium: 8.5 mg/dL — ABNORMAL LOW (ref 8.9–10.3)
Chloride: 101 mmol/L (ref 98–111)
Creatinine, Ser: 1.09 mg/dL (ref 0.61–1.24)
GFR, Estimated: >60 mL/min (ref >60)
Glucose, Bld: 153 mg/dL — ABNORMAL HIGH (ref 70–99)
Potassium: 2.8 mmol/L — ABNORMAL LOW (ref 3.5–5.1)
Sodium: 137 mmol/L (ref 135–145)
Total Bilirubin: 0.7 mg/dL (ref 0.3–1.2)
Total Protein: 7.4 g/dL (ref 6.5–8.1)

## 2022-03-18 LAB — CBC WITH DIFFERENTIAL/PLATELET
Abs Immature Granulocytes: 0.06 10*3/uL (ref 0.00–0.07)
Basophils Absolute: 0.1 10*3/uL (ref 0.0–0.1)
Basophils Relative: 1 %
Eosinophils Absolute: 0.1 10*3/uL (ref 0.0–0.5)
Eosinophils Relative: 1 %
HCT: 41.4 % (ref 39.0–52.0)
Hemoglobin: 13.6 g/dL (ref 13.0–17.0)
Immature Granulocytes: 1 %
Lymphocytes Relative: 28 %
Lymphs Abs: 2.6 10*3/uL (ref 0.7–4.0)
MCH: 32.2 pg (ref 26.0–34.0)
MCHC: 32.9 g/dL (ref 30.0–36.0)
MCV: 97.9 fL (ref 80.0–100.0)
Monocytes Absolute: 0.8 10*3/uL (ref 0.1–1.0)
Monocytes Relative: 9 %
Neutro Abs: 5.7 10*3/uL (ref 1.7–7.7)
Neutrophils Relative %: 60 %
Platelets: 261 10*3/uL (ref 150–400)
RBC: 4.23 MIL/uL (ref 4.22–5.81)
RDW: 13.2 % (ref 11.5–15.5)
WBC: 9.4 10*3/uL (ref 4.0–10.5)
nRBC: 0 % (ref 0.0–0.2)

## 2022-03-18 LAB — ACETAMINOPHEN LEVEL: Acetaminophen (Tylenol), Serum: <10 ug/mL — ABNORMAL LOW (ref 10–30)

## 2022-03-18 LAB — URINE DRUG SCREEN, QUALITATIVE (ARMC ONLY)
Amphetamines, Ur Screen: NOT DETECTED
Barbiturates, Ur Screen: NOT DETECTED
Benzodiazepine, Ur Scrn: NOT DETECTED
Cannabinoid 50 Ng, Ur ~~LOC~~: POSITIVE — AB
Cocaine Metabolite,Ur ~~LOC~~: NOT DETECTED
MDMA (Ecstasy)Ur Screen: NOT DETECTED
Methadone Scn, Ur: NOT DETECTED
Opiate, Ur Screen: NOT DETECTED
Phencyclidine (PCP) Ur S: NOT DETECTED
Tricyclic, Ur Screen: NOT DETECTED

## 2022-03-18 LAB — ETHANOL: Alcohol, Ethyl (B): 56 mg/dL — ABNORMAL HIGH (ref <10)

## 2022-03-18 LAB — SALICYLATE LEVEL: Salicylate Lvl: <7 mg/dL — ABNORMAL LOW (ref 7.0–30.0)

## 2022-03-18 MED ORDER — POTASSIUM CHLORIDE CRYS ER 20 MEQ PO TBCR
40.0000 meq | EXTENDED_RELEASE_TABLET | Freq: Once | ORAL | Status: AC
  Administered 2022-03-18: 40 meq via ORAL
  Filled 2022-03-18: qty 2

## 2022-03-18 NOTE — Discharge Instructions (Signed)
Your lab tests and chest x-ray were all okay today.  Consider seeking help for substance abuse.  It is important to avoid using multiple mind altering substances together, because you can have out sized, dangerous effects.

## 2022-03-18 NOTE — ED Provider Notes (Signed)
[facility] Provider Note    Event Date/Time   First MD Initiated Contact with Patient 03/18/22 1959     (approximate)   History   Chief Complaint: Drug Overdose   HPI  Craig Bonilla is a 36 y.o. male with no significant past medical history who is brought to the ED by EMS due to overdose.  When he found the patient, he was apneic.  First responder fire department had given 2 mg of Narcan without immediate response.  He was given BVM ventilation for 15 minutes and then regained consciousness.  Arrives to the ED awake and alert, denying any symptoms.  He reports that he was drinking alcohol heavily tonight, also used marijuana, and took a unknown blue pill.  Denies routine drug use.  Denies any history of injection drug use.  Currently no chest pain or shortness of breath, no fever, feels normal and request ginger ale.     Physical Exam   Triage Vital Signs: ED Triage Vitals  Enc Vitals Group     BP 03/18/22 2014 123/88     Pulse Rate 03/18/22 2014 92     Resp 03/18/22 2014 16     Temp 03/18/22 2014 98 F (36.7 C)     Temp src --      SpO2 03/18/22 2014 97 %     Weight 03/18/22 2004 135 lb (61.2 kg)     Height 03/18/22 2004 5\' 10"  (1.778 m)     Head Circumference --      Peak Flow --      Pain Score 03/18/22 2003 0     Pain Loc --      Pain Edu? --      Excl. in GC? --     Most recent vital signs: Vitals:   03/18/22 2014 03/18/22 2104  BP: 123/88 (!) 140/92  Pulse: 92 100  Resp: 16 16  Temp: 98 F (36.7 C)   SpO2: 97% 93%    General: Awake, no distress.  CV:  Good peripheral perfusion.  Regular rate and rhythm.  Normal distal pulses. Resp:  Normal effort.  Clear to auscultation bilaterally Abd:  No distention.  Soft nontender Other:  Moist oral mucosa.  Neuro intact.  Steady gait.  Clear speech.   ED Results / Procedures / Treatments   Labs (all labs ordered are listed, but only abnormal results are displayed) Labs  Reviewed  COMPREHENSIVE METABOLIC PANEL - Abnormal; Notable for the following components:      Result Value   Potassium 2.8 (*)    CO2 21 (*)    Glucose, Bld 153 (*)    Calcium 8.5 (*)    AST 55 (*)    All other components within normal limits  ACETAMINOPHEN LEVEL - Abnormal; Notable for the following components:   Acetaminophen (Tylenol), Serum <10 (*)    All other components within normal limits  SALICYLATE LEVEL - Abnormal; Notable for the following components:   Salicylate Lvl <7.0 (*)    All other components within normal limits  URINE DRUG SCREEN, QUALITATIVE ([facility] ONLY) - Abnormal; Notable for the following components:   Cannabinoid 50 Ng, Ur [place] POSITIVE (*)    All other components within normal limits  CBC WITH DIFFERENTIAL/PLATELET  ETHANOL     EKG    RADIOLOGY Chest x-ray interpreted by me, appears normal.  Radiology report reviewed.   PROCEDURES:  Procedures   MEDICATIONS ORDERED IN ED: Medications  potassium chloride  SA (KLOR-CON M) CR tablet 40 mEq (40 mEq Oral Given 03/18/22 2112)     IMPRESSION / MDM / ASSESSMENT AND PLAN / ED COURSE  I reviewed the triage vital signs and the nursing notes.                              Differential diagnosis includes, but is not limited to, alcohol intoxication, polysubstance abuse, accidental overdose, electrolyte abnormality  Patient's presentation is most consistent with acute presentation with potential threat to life or bodily function.  Patient brought to the ED after suspected overdose due to polysubstance abuse.  This is not habitual for the patient and appears accidental.  Currently asymptomatic, normal vital signs.  Patient was observed in the ED for approximately hour and a half, remains lucid, steady on his feet, asymptomatic.  Serum labs unremarkable except for potassium of 2.8 which has been supplemented orally.  Not a danger to himself or others, medically stable for discharge.       FINAL CLINICAL  IMPRESSION(S) / ED DIAGNOSES   Final diagnoses:  Polysubstance abuse ([place])     Rx / DC Orders   ED Discharge Orders     None        Note:  This document was prepared using Dragon voice recognition software and may include unintentional dictation errors.   Carrie Mew, MD 03/18/22 2121

## 2022-03-18 NOTE — ED Triage Notes (Addendum)
Pt BIB EMS for overdose with unknown pain pill that could possibly be laced with fentanyl. Pt received 2mg  of narcan intranasally. Pt was bagged for about 15-20 minutes by EMS. ETOH on board, per pt he has drank 1 and 1/2 40 oz beers.  Pt a&ox4.    18G L FA

## 2022-03-18 NOTE — ED Notes (Signed)
Signature pad not working, patient verbalized understanding.

## 2024-05-03 ENCOUNTER — Other Ambulatory Visit: Payer: Self-pay

## 2024-05-03 DIAGNOSIS — D696 Thrombocytopenia, unspecified: Secondary | ICD-10-CM | POA: Diagnosis present

## 2024-05-03 DIAGNOSIS — K760 Fatty (change of) liver, not elsewhere classified: Secondary | ICD-10-CM | POA: Diagnosis present

## 2024-05-03 DIAGNOSIS — Z79899 Other long term (current) drug therapy: Secondary | ICD-10-CM

## 2024-05-03 DIAGNOSIS — E878 Other disorders of electrolyte and fluid balance, not elsewhere classified: Secondary | ICD-10-CM | POA: Diagnosis present

## 2024-05-03 DIAGNOSIS — F10139 Alcohol abuse with withdrawal, unspecified: Secondary | ICD-10-CM | POA: Diagnosis present

## 2024-05-03 DIAGNOSIS — B179 Acute viral hepatitis, unspecified: Secondary | ICD-10-CM | POA: Diagnosis present

## 2024-05-03 DIAGNOSIS — R739 Hyperglycemia, unspecified: Secondary | ICD-10-CM | POA: Diagnosis present

## 2024-05-03 DIAGNOSIS — Z885 Allergy status to narcotic agent status: Secondary | ICD-10-CM

## 2024-05-03 DIAGNOSIS — K852 Alcohol induced acute pancreatitis without necrosis or infection: Principal | ICD-10-CM | POA: Diagnosis present

## 2024-05-03 DIAGNOSIS — F17210 Nicotine dependence, cigarettes, uncomplicated: Secondary | ICD-10-CM | POA: Diagnosis present

## 2024-05-03 DIAGNOSIS — E871 Hypo-osmolality and hyponatremia: Secondary | ICD-10-CM | POA: Diagnosis present

## 2024-05-03 MED ORDER — ONDANSETRON 4 MG PO TBDP
4.0000 mg | ORAL_TABLET | Freq: Once | ORAL | Status: AC
  Administered 2024-05-03: 4 mg via ORAL
  Filled 2024-05-03: qty 1

## 2024-05-03 NOTE — ED Triage Notes (Signed)
 Pt presents for 10/10 abdominal pain with abdominal pain with nausea and vomiting. Hx pancreatitis. States this feels the same as previous flares. Endorsing etoh yesterday (none today). Took Tylenol  without relief.

## 2024-05-04 ENCOUNTER — Inpatient Hospital Stay
Admission: EM | Admit: 2024-05-04 | Discharge: 2024-05-07 | DRG: 439 | Discharge status: Against Medical Advice | Payer: Self-pay | Attending: Family Medicine | Admitting: Family Medicine

## 2024-05-04 ENCOUNTER — Emergency Department: Payer: Self-pay

## 2024-05-04 DIAGNOSIS — B179 Acute viral hepatitis, unspecified: Secondary | ICD-10-CM

## 2024-05-04 DIAGNOSIS — K852 Alcohol induced acute pancreatitis without necrosis or infection: Principal | ICD-10-CM | POA: Diagnosis present

## 2024-05-04 DIAGNOSIS — R748 Abnormal levels of other serum enzymes: Secondary | ICD-10-CM | POA: Diagnosis present

## 2024-05-04 LAB — CBC
HCT: 40.7 % (ref 39.0–52.0)
Hemoglobin: 14.7 g/dL (ref 13.0–17.0)
MCH: 33 pg (ref 26.0–34.0)
MCHC: 36.1 g/dL — ABNORMAL HIGH (ref 30.0–36.0)
MCV: 91.3 fL (ref 80.0–100.0)
Platelets: 225 K/uL (ref 150–400)
RBC: 4.46 MIL/uL (ref 4.22–5.81)
RDW: 13.2 % (ref 11.5–15.5)
WBC: 7.7 K/uL (ref 4.0–10.5)
nRBC: 0 % (ref 0.0–0.2)

## 2024-05-04 LAB — URINALYSIS, ROUTINE W REFLEX MICROSCOPIC
Bacteria, UA: NONE SEEN
Bilirubin Urine: NEGATIVE
Glucose, UA: 50 mg/dL — AB
Hgb urine dipstick: NEGATIVE
Ketones, ur: NEGATIVE mg/dL
Leukocytes,Ua: NEGATIVE
Nitrite: NEGATIVE
Protein, ur: 30 mg/dL — AB
Specific Gravity, Urine: 1.041 — ABNORMAL HIGH (ref 1.005–1.030)
pH: 5 (ref 5.0–8.0)

## 2024-05-04 LAB — COMPREHENSIVE METABOLIC PANEL WITH GFR
ALT: 38 U/L (ref 0–44)
AST: 87 U/L — ABNORMAL HIGH (ref 15–41)
Albumin: 3.8 g/dL (ref 3.5–5.0)
Alkaline Phosphatase: 91 U/L (ref 38–126)
Anion gap: 17 — ABNORMAL HIGH (ref 5–15)
BUN: 7 mg/dL (ref 6–20)
CO2: 19 mmol/L — ABNORMAL LOW (ref 22–32)
Calcium: 8.7 mg/dL — ABNORMAL LOW (ref 8.9–10.3)
Chloride: 95 mmol/L — ABNORMAL LOW (ref 98–111)
Creatinine, Ser: 0.91 mg/dL (ref 0.61–1.24)
GFR, Estimated: >60 mL/min (ref >60)
Glucose, Bld: 233 mg/dL — ABNORMAL HIGH (ref 70–99)
Potassium: 3.6 mmol/L (ref 3.5–5.1)
Sodium: 131 mmol/L — ABNORMAL LOW (ref 135–145)
Total Bilirubin: 1.4 mg/dL — ABNORMAL HIGH (ref 0.0–1.2)
Total Protein: 7.1 g/dL (ref 6.5–8.1)

## 2024-05-04 LAB — HEMOGLOBIN A1C
Hgb A1c MFr Bld: 5.2 % (ref 4.8–5.6)
Mean Plasma Glucose: 102.54 mg/dL

## 2024-05-04 LAB — LIPASE, BLOOD: Lipase: 1643 U/L — ABNORMAL HIGH (ref 11–51)

## 2024-05-04 MED ORDER — HYDROMORPHONE HCL 1 MG/ML IJ SOLN
0.5000 mg | INTRAMUSCULAR | Status: DC | PRN
  Administered 2024-05-04 – 2024-05-06 (×11): 1 mg via INTRAVENOUS
  Filled 2024-05-04 (×11): qty 1

## 2024-05-04 MED ORDER — ACETAMINOPHEN 325 MG PO TABS: 650.0000 mg | ORAL_TABLET | Freq: Four times a day (QID) | ORAL | Status: DC | PRN

## 2024-05-04 MED ORDER — ACETAMINOPHEN 650 MG RE SUPP
650.0000 mg | Freq: Four times a day (QID) | RECTAL | Status: DC | PRN
Start: 2024-05-04 — End: 2024-05-06

## 2024-05-04 MED ORDER — LORAZEPAM 2 MG/ML IJ SOLN: 1.0000 mg | INTRAMUSCULAR | Status: AC | PRN

## 2024-05-04 MED ORDER — PROCHLORPERAZINE EDISYLATE 10 MG/2ML IJ SOLN: 10.0000 mg | Freq: Four times a day (QID) | INTRAMUSCULAR | Status: DC | PRN

## 2024-05-04 MED ORDER — KETOROLAC TROMETHAMINE 30 MG/ML IJ SOLN
30.0000 mg | Freq: Four times a day (QID) | INTRAMUSCULAR | Status: DC | PRN
  Administered 2024-05-04 – 2024-05-07 (×3): 30 mg via INTRAVENOUS
  Filled 2024-05-04 (×3): qty 1

## 2024-05-04 MED ORDER — DEXTROSE IN LACTATED RINGERS 5 % IV SOLN: INTRAVENOUS | Status: DC

## 2024-05-04 MED ORDER — ONDANSETRON HCL 4 MG/2ML IJ SOLN: 4.0000 mg | Freq: Four times a day (QID) | INTRAMUSCULAR | Status: DC | PRN

## 2024-05-04 MED ORDER — FOLIC ACID 1 MG PO TABS
1.0000 mg | ORAL_TABLET | Freq: Every day | ORAL | Status: DC
  Administered 2024-05-04 – 2024-05-06 (×3): 1 mg via ORAL
  Filled 2024-05-04 (×3): qty 1

## 2024-05-04 MED ORDER — ENOXAPARIN SODIUM 40 MG/0.4ML IJ SOSY
40.0000 mg | PREFILLED_SYRINGE | INTRAMUSCULAR | Status: DC
  Administered 2024-05-04 – 2024-05-06 (×3): 40 mg via SUBCUTANEOUS
  Filled 2024-05-04 (×3): qty 0.4

## 2024-05-04 MED ORDER — THIAMINE MONONITRATE 100 MG PO TABS
100.0000 mg | ORAL_TABLET | Freq: Every day | ORAL | Status: DC
  Administered 2024-05-04 – 2024-05-06 (×3): 100 mg via ORAL
  Filled 2024-05-04 (×3): qty 1

## 2024-05-04 MED ORDER — MORPHINE SULFATE (PF) 4 MG/ML IV SOLN
4.0000 mg | Freq: Once | INTRAVENOUS | Status: AC
  Administered 2024-05-04: 4 mg via INTRAVENOUS
  Filled 2024-05-04: qty 1

## 2024-05-04 MED ORDER — OXYCODONE HCL 5 MG PO TABS
5.0000 mg | ORAL_TABLET | ORAL | Status: DC | PRN
  Administered 2024-05-04 – 2024-05-06 (×5): 5 mg via ORAL
  Filled 2024-05-04 (×5): qty 1

## 2024-05-04 MED ORDER — LORAZEPAM 1 MG PO TABS: 1.0000 mg | ORAL_TABLET | ORAL | Status: AC | PRN

## 2024-05-04 MED ORDER — THIAMINE HCL 100 MG/ML IJ SOLN
100.0000 mg | Freq: Every day | INTRAMUSCULAR | Status: DC
  Filled 2024-05-04: qty 2

## 2024-05-04 MED ORDER — LACTATED RINGERS IV SOLN: INTRAVENOUS | Status: AC

## 2024-05-04 MED ORDER — METOCLOPRAMIDE HCL 5 MG/ML IJ SOLN
10.0000 mg | Freq: Once | INTRAMUSCULAR | Status: AC
  Administered 2024-05-04: 10 mg via INTRAVENOUS
  Filled 2024-05-04: qty 2

## 2024-05-04 MED ORDER — ADULT MULTIVITAMIN W/MINERALS CH
1.0000 | ORAL_TABLET | Freq: Every day | ORAL | Status: DC
  Administered 2024-05-04 – 2024-05-06 (×3): 1 via ORAL
  Filled 2024-05-04 (×3): qty 1

## 2024-05-04 MED ORDER — PANTOPRAZOLE SODIUM 40 MG IV SOLR
40.0000 mg | INTRAVENOUS | Status: DC
  Administered 2024-05-04 – 2024-05-07 (×3): 40 mg via INTRAVENOUS
  Filled 2024-05-04 (×3): qty 10

## 2024-05-04 MED ORDER — SODIUM CHLORIDE 0.9 % IV BOLUS
1000.0000 mL | Freq: Once | INTRAVENOUS | Status: AC
  Administered 2024-05-04: 1000 mL via INTRAVENOUS

## 2024-05-04 MED ORDER — ONDANSETRON HCL 4 MG PO TABS: 4.0000 mg | ORAL_TABLET | Freq: Four times a day (QID) | ORAL | Status: DC | PRN

## 2024-05-04 MED ORDER — IOHEXOL 300 MG/ML  SOLN
100.0000 mL | Freq: Once | INTRAMUSCULAR | Status: AC | PRN
  Administered 2024-05-04: 100 mL via INTRAVENOUS

## 2024-05-04 MED ORDER — ONDANSETRON HCL 4 MG/2ML IJ SOLN
4.0000 mg | Freq: Once | INTRAMUSCULAR | Status: AC
  Administered 2024-05-04: 4 mg via INTRAVENOUS
  Filled 2024-05-04: qty 2

## 2024-05-04 NOTE — Progress Notes (Signed)
 Pt admitted  to 121A from ED, Dx: Acute pancreatitis, denies pain, no distress noted, discussed POC, informed to call for needs, call bell in reach, bed in low locked position.

## 2024-05-04 NOTE — ED Provider Notes (Signed)
 [facility] Provider Note    Event Date/Time   First MD Initiated Contact with Patient 05/04/24 0136     (approximate)   History   Abdominal Pain   HPI  Craig Bonilla is a 38 y.o. male who presents to the emergency department today because of concerns for severe abdominal pain.  Pain started yesterday morning.  He had drank alcohol prior to that.  He describes it as a sharp severe pain.  Has been associated with nonbloody emesis.  It reminds him of when he has had pancreatitis in the past.  He states that previous episodes were related to alcohol use.     Physical Exam   Triage Vital Signs: ED Triage Vitals  Encounter Vitals Group     BP 05/03/24 2352 (!) 126/94     Girls Systolic BP Percentile --      Girls Diastolic BP Percentile --      Boys Systolic BP Percentile --      Boys Diastolic BP Percentile --      Pulse Rate 05/03/24 2352 97     Resp 05/03/24 2352 18     Temp 05/03/24 2352 97.9 F (36.6 C)     Temp Source 05/03/24 2352 Oral     SpO2 05/03/24 2352 97 %     Weight 05/03/24 2354 130 lb (59 kg)     Height 05/03/24 2354 5' 10 (1.778 m)     Head Circumference --      Peak Flow --      Pain Score 05/03/24 2353 10     Pain Loc --      Pain Education --      Exclude from Growth Chart --     Most recent vital signs: Vitals:   05/03/24 2352  BP: (!) 126/94  Pulse: 97  Resp: 18  Temp: 97.9 F (36.6 C)  SpO2: 97%   General: Awake, alert, oriented. CV:  Good peripheral perfusion. Regular rate and rhythm. Resp:  Normal effort. Lungs clear. Abd:  No distention. Tender to palpation in the upper abdomen.  ED Results / Procedures / Treatments   Labs (all labs ordered are listed, but only abnormal results are displayed) Labs Reviewed  LIPASE, BLOOD - Abnormal; Notable for the following components:      Result Value   Lipase 1,643 (*)    All other components within normal limits  COMPREHENSIVE METABOLIC PANEL WITH GFR -  Abnormal; Notable for the following components:   Sodium 131 (*)    Chloride 95 (*)    CO2 19 (*)    Glucose, Bld 233 (*)    Calcium 8.7 (*)    AST 87 (*)    Total Bilirubin 1.4 (*)    Anion gap 17 (*)    All other components within normal limits  CBC - Abnormal; Notable for the following components:   MCHC 36.1 (*)    All other components within normal limits  URINALYSIS, ROUTINE W REFLEX MICROSCOPIC     EKG  None   RADIOLOGY I independently interpreted and visualized the CT abd/pel. My interpretation: pancreatic pseudocysts, peripancreatic fluid Radiology interpretation:  IMPRESSION:  1. Acute pancreatitis with significant peripancreatic fluid and pancreatic  phlegmon, with new 16 mm cystic changes in the pancreatic body possibly  representing small pseudocysts.     PROCEDURES:  Critical Care performed: No    MEDICATIONS ORDERED IN ED: Medications  ondansetron  (ZOFRAN -ODT) disintegrating tablet 4 mg (4 mg  Oral Given 05/03/24 2359)     IMPRESSION / MDM / ASSESSMENT AND PLAN / ED COURSE  I reviewed the triage vital signs and the nursing notes.                              Differential diagnosis includes, but is not limited to, pancreatitis, gallbladder disease, gastritis  Patient's presentation is most consistent with acute presentation with potential threat to life or bodily function.   Patient presented to the emergency department today with concerns for severe abdominal pain, nausea and vomiting.  On exam patient is tender in the upper abdomen.  Blood work is notable for elevated lipase.  Did obtain a CT scan given significant of patient's pain.  This was notable for apparent pseudocyst and peripancreatic fluid and phlegmon.  At this time however no clear indication for infection with no fever and lack of leukocytosis here.  Patient was started on IV fluids and pain medication in the emergency department.  Will discuss with the hospitalist service for  admission.   FINAL CLINICAL IMPRESSION(S) / ED DIAGNOSES   Final diagnoses:  Alcohol-induced acute pancreatitis, unspecified complication status      Note:  This document was prepared using Dragon voice recognition software and may include unintentional dictation errors.    Floy Roberts, MD 05/04/24 [phone]
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
## 2024-05-04 NOTE — Discharge Instructions (Signed)
 [facility]  [facility] [place] is a nonprofit health center offering free, comprehensive care for uninsured and under-served residents in [place]. The clinic provides both acute and chronic disease management with dignity, equity, professionalism, and compassion.   Address: [address], [address], [place], [place] [address]  Phone: [phone]  Free Clinic Directory  Hours of Operation:  Tuesday: 9:00 AM - 4:00 PM  [facility] [place]  Wednesday: 9:00 AM - 4:00 PM (virtual appointments only)  [facility] [place]  Thursday: 9:00 AM - 8:00 PM  [facility] [place]  Friday: Closed  [facility] [place]

## 2024-05-05 ENCOUNTER — Inpatient Hospital Stay: Payer: Self-pay

## 2024-05-05 DIAGNOSIS — R748 Abnormal levels of other serum enzymes: Secondary | ICD-10-CM | POA: Diagnosis present

## 2024-05-05 LAB — CBC
HCT: 40.7 % (ref 39.0–52.0)
Hemoglobin: 14.3 g/dL (ref 13.0–17.0)
MCH: 32.4 pg (ref 26.0–34.0)
MCHC: 35.1 g/dL (ref 30.0–36.0)
MCV: 92.1 fL (ref 80.0–100.0)
Platelets: 130 K/uL — ABNORMAL LOW (ref 150–400)
RBC: 4.42 MIL/uL (ref 4.22–5.81)
RDW: 13 % (ref 11.5–15.5)
WBC: 7.3 K/uL (ref 4.0–10.5)
nRBC: 0 % (ref 0.0–0.2)

## 2024-05-05 LAB — COMPREHENSIVE METABOLIC PANEL WITH GFR
ALT: 618 U/L — ABNORMAL HIGH (ref 0–44)
AST: 3090 U/L — ABNORMAL HIGH (ref 15–41)
Albumin: 3.4 g/dL — ABNORMAL LOW (ref 3.5–5.0)
Alkaline Phosphatase: 98 U/L (ref 38–126)
Anion gap: 10 (ref 5–15)
BUN: <5 mg/dL — ABNORMAL LOW (ref 6–20)
CO2: 25 mmol/L (ref 22–32)
Calcium: 8 mg/dL — ABNORMAL LOW (ref 8.9–10.3)
Chloride: 98 mmol/L (ref 98–111)
Creatinine, Ser: 0.95 mg/dL (ref 0.61–1.24)
GFR, Estimated: >60 mL/min (ref >60)
Glucose, Bld: 77 mg/dL (ref 70–99)
Potassium: 3.7 mmol/L (ref 3.5–5.1)
Sodium: 133 mmol/L — ABNORMAL LOW (ref 135–145)
Total Bilirubin: 2.2 mg/dL — ABNORMAL HIGH (ref 0.0–1.2)
Total Protein: 6.3 g/dL — ABNORMAL LOW (ref 6.5–8.1)

## 2024-05-05 LAB — MAGNESIUM: Magnesium: 1.3 mg/dL — ABNORMAL LOW (ref 1.7–2.4)

## 2024-05-05 LAB — HEPATITIS PANEL, ACUTE
HCV Ab: NONREACTIVE
Hep A IgM: NONREACTIVE
Hep B C IgM: NONREACTIVE
Hepatitis B Surface Ag: NONREACTIVE

## 2024-05-05 LAB — LIPASE, BLOOD: Lipase: 1101 U/L — ABNORMAL HIGH (ref 11–51)

## 2024-05-05 LAB — HIV ANTIBODY (ROUTINE TESTING W REFLEX): HIV Screen 4th Generation wRfx: NONREACTIVE

## 2024-05-05 LAB — PHOSPHORUS: Phosphorus: 1.7 mg/dL — ABNORMAL LOW (ref 2.5–4.6)

## 2024-05-05 MED ORDER — SODIUM PHOSPHATES 45 MMOLE/15ML IV SOLN
30.0000 mmol | Freq: Once | INTRAVENOUS | Status: AC
  Administered 2024-05-05: 30 mmol via INTRAVENOUS
  Filled 2024-05-05: qty 10

## 2024-05-05 MED ORDER — MAGNESIUM SULFATE 4 GM/100ML IV SOLN
4.0000 g | Freq: Once | INTRAVENOUS | Status: AC
  Administered 2024-05-05: 4 g via INTRAVENOUS
  Filled 2024-05-05: qty 100

## 2024-05-05 MED ORDER — CHLORDIAZEPOXIDE HCL 25 MG PO CAPS
25.0000 mg | ORAL_CAPSULE | Freq: Four times a day (QID) | ORAL | Status: DC
  Administered 2024-05-05 – 2024-05-06 (×4): 25 mg via ORAL
  Filled 2024-05-05 (×4): qty 1

## 2024-05-05 NOTE — Progress Notes (Addendum)
 Progress Note    Craig Bonilla  FMW:[id number] DOB: 1985/06/23  DOA: 05/04/2024 PCP: Patient, No Pcp Per      Brief Narrative:    Medical records reviewed and are as summarized below:  Craig Bonilla is a 38 y.o. male with medical history significant for polysubstance use disorder, history of pancreatitis about 2 years prior to admission, who presented to the hospital with nausea, vomiting and abdominal pain.  Vital signs in the ED: Temperature 97.9 F, respiratory rate 18, pulse 97, BP 126/94, O2 sat 97% on room air.  Lipase 1,643  CT abdomen pelvis  IMPRESSION: 1. Acute pancreatitis with significant peripancreatic fluid and pancreatic phlegmon, with new 16 mm cystic changes in the pancreatic body possibly representing small pseudocysts.       Assessment/Plan:   Principal Problem:   Acute alcoholic pancreatitis Active Problems:   Elevated liver enzymes   Hypomagnesemia   Hypophosphatemia   Body mass index is 18.65 kg/m.   Acute alcoholic pancreatitis: Continue clear liquid diet for now.  Analgesics as needed for pain.  Antiemetics as needed for nausea/vomiting. Lipase down from 1,643-1,101.   Elevated liver enzymes: Liver enzymes are worse today.  AST up from 87-3,090, ALT up from 38-618, total bilirubin up from 1.4-2.2. Check hepatitis panel and obtain abdominal ultrasound for further evaluation.   Hyponatremia: Improving.  Sodium up from 131-133. Hypomagnesemia: Potassium 1.3.  Replete with IV magnesium  sulfate Hypophosphatemia: Phosphorus 1.7.  Replete with IV sodium phosphate  infusion.   Polysubstance use disorder (alcohol, tobacco, marijuana): Patient has been advised to quit using the substances.  The risk of developing recurrent acute pancreatitis or chronic pancreatitis with continued use of alcohol was reiterated. Benzodiazepines as needed per CIWA protocol.  Start Librium .   Diet Order             Diet clear liquid Room  service appropriate? Yes; Fluid consistency: Thin  Diet effective now                                  Consultants: None  Procedures: None    Medications:    chlordiazePOXIDE   25 mg Oral QID   enoxaparin  (LOVENOX ) injection  40 mg Subcutaneous Q24H   folic acid   1 mg Oral Daily   multivitamin with minerals  1 tablet Oral Daily   pantoprazole  (PROTONIX ) IV  40 mg Intravenous Q24H   thiamine   100 mg Oral Daily   Or   thiamine   100 mg Intravenous Daily   Continuous Infusions:  magnesium  sulfate bolus IVPB 4 g (05/05/24 0917)   sodium PHOSPHATE  IVPB (in mmol)       Anti-infectives (From admission, onward)    None              Family Communication/Anticipated D/C date and plan/Code Status   DVT prophylaxis: enoxaparin  (LOVENOX ) injection 40 mg Start: 05/04/24 0800     Code Status: Full Code  Family Communication: None Disposition Plan: Plan to discharge home   Status is: Inpatient Remains inpatient appropriate because: Acute pancreatitis       Subjective:   Interval events noted.  He complains of abdominal pain/epigastric pain that radiates to the mid back.  He has some nausea but no vomiting.  No other complaints.  Objective:    Vitals:   05/04/24 1927 05/04/24 2046 05/05/24 0456 05/05/24 0803  BP: (!) 125/92 (!) 133/97 133/89 126/87  Pulse: 81 97 87 87  Resp: 17 17 16 18   Temp: 99.2 F (37.3 C) 99 F (37.2 C) 100.1 F (37.8 C) 98.9 F (37.2 C)  TempSrc: Oral Oral Oral   SpO2: 96% 98% 97% 98%  Weight:      Height:       No data found.  No intake or output data in the 24 hours ending 05/05/24 1059 Filed Weights   05/03/24 2354  Weight: 59 kg    Exam:  GEN: NAD SKIN: Warm and dry EYES: No pallor or icterus ENT: MMM CV: RRR PULM: CTA B ABD: soft, ND, epigastric tenderness, left upper quadrant tenderness without rebound tenderness or guarding, +BS CNS: AAO x 3, non focal EXT: No edema or  tenderness        Data Reviewed:   I have personally reviewed following labs and imaging studies:  Labs: Labs show the following:   Basic Metabolic Panel: Recent Labs  Lab 05/04/24 0002 05/05/24 0614  NA 131* 133*  K 3.6 3.7  CL 95* 98  CO2 19* 25  GLUCOSE 233* 77  BUN 7 <5*  CREATININE 0.91 0.95  CALCIUM 8.7* 8.0*  MG  --  1.3*  PHOS  --  1.7*   GFR Estimated Creatinine Clearance: 87.1 mL/min (by C-G formula based on SCr of 0.95 mg/dL). Liver Function Tests: Recent Labs  Lab 05/04/24 0002 05/05/24 0614  AST 87* 3,090*  ALT 38 618*  ALKPHOS 91 98  BILITOT 1.4* 2.2*  PROT 7.1 6.3*  ALBUMIN 3.8 3.4*   Recent Labs  Lab 05/04/24 0002 05/05/24 0614  LIPASE 1,643* 1,101*   No results for input(s): AMMONIA in the last 168 hours. Coagulation profile No results for input(s): INR, PROTIME in the last 168 hours.  CBC: Recent Labs  Lab 05/04/24 0002 05/05/24 0614  WBC 7.7 7.3  HGB 14.7 14.3  HCT 40.7 40.7  MCV 91.3 92.1  PLT 225 130*   Cardiac Enzymes: No results for input(s): CKTOTAL, CKMB, CKMBINDEX, TROPONINI in the last 168 hours. BNP (last 3 results) No results for input(s): PROBNP in the last 8760 hours. CBG: No results for input(s): GLUCAP in the last 168 hours. D-Dimer: No results for input(s): DDIMER in the last 72 hours. Hgb A1c: Recent Labs    05/04/24 0002  HGBA1C 5.2   Lipid Profile: No results for input(s): CHOL, HDL, LDLCALC, TRIG, CHOLHDL, LDLDIRECT in the last 72 hours. Thyroid function studies: No results for input(s): TSH, T4TOTAL, T3FREE, THYROIDAB in the last 72 hours.  Invalid input(s): FREET3 Anemia work up: No results for input(s): VITAMINB12, FOLATE, FERRITIN, TIBC, IRON, RETICCTPCT in the last 72 hours. Sepsis Labs: Recent Labs  Lab 05/04/24 0002 05/05/24 0614  WBC 7.7 7.3    Microbiology No results found for this or any previous visit (from the past 240  hours).  Procedures and diagnostic studies:  CT ABDOMEN PELVIS W CONTRAST Result Date: 05/04/2024 EXAM: CT ABDOMEN AND PELVIS WITH CONTRAST 05/04/2024 02:48:08 AM TECHNIQUE: CT of the abdomen and pelvis was performed with the administration of intravenous contrast. 100 mL of iohexol  (OMNIPAQUE ) 300 MG/ML solution was administered. Multiplanar reformatted images are provided for review. Automated exposure control, iterative reconstruction, and/or weight-based adjustment of the mA/kV was utilized to reduce the radiation dose to as low as reasonably achievable. COMPARISON: 05/13/2019 CLINICAL HISTORY: epigastric pain and history of pancreatitis. FINDINGS: LOWER CHEST: Lung bases are clear. LIVER: The liver demonstrates diffuse fatty infiltration. GALLBLADDER AND BILE DUCTS: The gallbladder  is decompressed. No biliary ductal dilatation. SPLEEN: The spleen is unremarkable. PANCREAS: The pancreas demonstrates significant peripancreatic fluid consistent with acute pancreatitis. Cystic changes are noted in the body of the pancreas, which may represent small pseudocysts. The largest of these measures 16 mm in dimension. This was not present on the prior exam. The pancreatic phlegmon extends along the anterior aspect of Gerota's fascia and surrounds the second portion of the duodenum. ADRENAL GLANDS: The adrenal glands are unremarkable. KIDNEYS, URETERS AND BLADDER: Kidneys demonstrate a normal enhancement pattern. No obstructive changes are seen. No calculi are noted. The bladder is partially distended. GI AND BOWEL: Stomach demonstrates no acute abnormality. The small bowel is otherwise within normal limits. No obstructive or inflammatory changes of the colon are seen. The appendix is within normal limits. There is no bowel obstruction. PERITONEUM AND RETROPERITONEUM: Mild free fluid is noted in the pelvis related to the underlying pancreatitis. No free air. VASCULATURE: The aorta demonstrates mild calcifications  without an aneurysmal dilatation. LYMPH NODES: No lymphadenopathy. REPRODUCTIVE ORGANS: The prostate is within normal limits. BONES AND SOFT TISSUES: Bony structures are within normal limits. No focal soft tissue abnormality. IMPRESSION: 1. Acute pancreatitis with significant peripancreatic fluid and pancreatic phlegmon, with new 16 mm cystic changes in the pancreatic body possibly representing small pseudocysts. Electronically signed by: Oneil Devonshire MD 05/04/2024 02:56 AM EST RP Workstation: [id number]               LOS: 1 day   Tatianna Ibbotson  [facility]   Pager on [email or web address]. If 7PM-7AM, please contact night-coverage at [email or web address]     05/05/2024, 10:59 AM

## 2024-05-06 DIAGNOSIS — B179 Acute viral hepatitis, unspecified: Secondary | ICD-10-CM

## 2024-05-06 LAB — COMPREHENSIVE METABOLIC PANEL WITH GFR
ALT: 1180 U/L — ABNORMAL HIGH (ref 0–44)
AST: 4313 U/L — ABNORMAL HIGH (ref 15–41)
Albumin: 3.1 g/dL — ABNORMAL LOW (ref 3.5–5.0)
Alkaline Phosphatase: 95 U/L (ref 38–126)
Anion gap: 7 (ref 5–15)
BUN: <5 mg/dL — ABNORMAL LOW (ref 6–20)
CO2: 27 mmol/L (ref 22–32)
Calcium: 7.7 mg/dL — ABNORMAL LOW (ref 8.9–10.3)
Chloride: 97 mmol/L — ABNORMAL LOW (ref 98–111)
Creatinine, Ser: 0.9 mg/dL (ref 0.61–1.24)
GFR, Estimated: >60 mL/min (ref >60)
Glucose, Bld: 97 mg/dL (ref 70–99)
Potassium: 3.9 mmol/L (ref 3.5–5.1)
Sodium: 130 mmol/L — ABNORMAL LOW (ref 135–145)
Total Bilirubin: 1.2 mg/dL (ref 0.0–1.2)
Total Protein: 5.8 g/dL — ABNORMAL LOW (ref 6.5–8.1)

## 2024-05-06 LAB — PROTIME-INR
INR: 1.2 (ref 0.8–1.2)
Prothrombin Time: 16.2 s — ABNORMAL HIGH (ref 11.4–15.2)

## 2024-05-06 LAB — CBC
HCT: 36.1 % — ABNORMAL LOW (ref 39.0–52.0)
Hemoglobin: 12.8 g/dL — ABNORMAL LOW (ref 13.0–17.0)
MCH: 32.4 pg (ref 26.0–34.0)
MCHC: 35.5 g/dL (ref 30.0–36.0)
MCV: 91.4 fL (ref 80.0–100.0)
Platelets: 98 K/uL — ABNORMAL LOW (ref 150–400)
RBC: 3.95 MIL/uL — ABNORMAL LOW (ref 4.22–5.81)
RDW: 13.2 % (ref 11.5–15.5)
WBC: 6.4 K/uL (ref 4.0–10.5)
nRBC: 0 % (ref 0.0–0.2)

## 2024-05-06 LAB — MAGNESIUM: Magnesium: 2 mg/dL (ref 1.7–2.4)

## 2024-05-06 LAB — SALICYLATE LEVEL: Salicylate Lvl: <7 mg/dL — ABNORMAL LOW (ref 7.0–30.0)

## 2024-05-06 LAB — ACETAMINOPHEN LEVEL: Acetaminophen (Tylenol), Serum: <10 ug/mL — ABNORMAL LOW (ref 10–30)

## 2024-05-06 LAB — LIPASE, BLOOD: Lipase: 223 U/L — ABNORMAL HIGH (ref 11–51)

## 2024-05-06 LAB — PHOSPHORUS: Phosphorus: 2 mg/dL — ABNORMAL LOW (ref 2.5–4.6)

## 2024-05-06 MED ORDER — DEXTROSE 5 % IV SOLN
15.0000 mg/kg/h | INTRAVENOUS | Status: DC
  Administered 2024-05-06: 15 mg/kg/h via INTRAVENOUS
  Filled 2024-05-06 (×3): qty 90

## 2024-05-06 MED ORDER — CHLORDIAZEPOXIDE HCL 25 MG PO CAPS
25.0000 mg | ORAL_CAPSULE | Freq: Three times a day (TID) | ORAL | Status: DC
  Administered 2024-05-06: 25 mg via ORAL
  Filled 2024-05-06: qty 1

## 2024-05-06 MED ORDER — K PHOS MONO-SOD PHOS DI & MONO 155-852-130 MG PO TABS
500.0000 mg | ORAL_TABLET | Freq: Three times a day (TID) | ORAL | Status: AC
  Administered 2024-05-06 (×2): 500 mg via ORAL
  Filled 2024-05-06 (×2): qty 2

## 2024-05-06 MED ORDER — ACETYLCYSTEINE LOAD VIA INFUSION
150.0000 mg/kg | Freq: Once | INTRAVENOUS | Status: AC
  Administered 2024-05-06: 8850 mg via INTRAVENOUS
  Filled 2024-05-06: qty 291
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
## 2024-05-06 NOTE — Progress Notes (Addendum)
 Progress Note    Craig Bonilla  FMW:[id number] DOB: December 14, 1985  DOA: 05/04/2024 PCP: Craig Bonilla      Brief Narrative:    Medical records reviewed and are as summarized below:  Craig Bonilla is a 38 y.o. male with medical history significant for polysubstance use disorder, history of pancreatitis about 2 years prior to admission, who presented to the hospital with nausea, vomiting and abdominal pain.  Vital signs in the ED: Temperature 97.9 F, respiratory rate 18, pulse 97, BP 126/94, O2 sat 97% on room air.  Lipase 1,643  CT abdomen pelvis  IMPRESSION: 1. Acute pancreatitis with significant peripancreatic fluid and pancreatic phlegmon, with new 16 mm cystic changes in the pancreatic body possibly representing small pseudocysts.       Assessment/Plan:   Principal Problem:   Acute alcoholic pancreatitis Active Problems:   Elevated liver enzymes   Hypomagnesemia   Hypophosphatemia   Acute hepatitis   Body mass index is 18.65 kg/m.   Acute alcoholic pancreatitis: Advance diet from clear liquid to full liquid diet.  Analgesics as needed for pain.  Antiemetics as needed for nausea/vomiting. Lipase down from 1,643-1,101-.223   Elevated liver enzymes: Worsening elevated liver enzymes.  AST up from [phone], ALT up from 38-618-1,180.  However, due to bilirubin down from 2.2-1.2.   Acute hepatitis panel was unremarkable. Liver ultrasound showed fatty liver but no gallstones. Consulted Craig Bonilla, gastroenterologist, to assist with management. Patient admitted to taking about 28 pills of Tylenol  prior to admission.  Acetaminophen  and salicylate level were unremarkable. INR 1.2. Appreciate input from gastroenterologist.  Patient has been started on Mucomyst  for possibility of acetaminophen  toxicity.   Hyponatremia: Fluctuating sodium level.  Sodium down from 133-130. Hypomagnesemia: Improved Hypophosphatemia: Improved from 1.7-2.0.   Start oral Neutra-Phos.     Thrombocytopenia: Platelet count slowly trending downward.  Continue to monitor.   Polysubstance use disorder (alcohol, tobacco, marijuana): Patient has been advised to quit using the substances.  The risk of developing recurrent acute pancreatitis or chronic pancreatitis with continued use of alcohol was reiterated. Benzodiazepines as needed Bonilla CIWA protocol.  Continue Librium    Diet Order             Diet full liquid Room service appropriate? Yes; Fluid consistency: Thin  Diet effective now                                  Consultants: Gastroenterologist  Procedures: None    Medications:    acetylcysteine   150 mg/kg Intravenous Once   chlordiazePOXIDE   25 mg Oral QID   enoxaparin  (LOVENOX ) injection  40 mg Subcutaneous Q24H   folic acid   1 mg Oral Daily   multivitamin with minerals  1 tablet Oral Daily   pantoprazole  (PROTONIX ) IV  40 mg Intravenous Q24H   thiamine   100 mg Oral Daily   Or   thiamine   100 mg Intravenous Daily   Continuous Infusions:  acetylcysteine        Anti-infectives (From admission, onward)    None              Family Communication/Anticipated D/C date and plan/Code Status   DVT prophylaxis: enoxaparin  (LOVENOX ) injection 40 mg Start: 05/04/24 0800     Code Status: Full Code  Family Communication: None Disposition Plan: Plan to discharge home   Status is: Inpatient Remains inpatient appropriate because: Acute pancreatitis  Subjective:   Interval events noted.  He feels better today.  No abdominal pain or vomiting.  Objective:    Vitals:   05/06/24 0439 05/06/24 0806 05/06/24 0814 05/06/24 1549  BP: 130/88 117/84 116/86 129/88  Pulse: 84 98 96 77  Resp: 16 16 18 17   Temp: 98.7 F (37.1 C) 99.8 F (37.7 C) 98.6 F (37 C) 99.6 F (37.6 C)  TempSrc:  Oral Oral Oral  SpO2: 100% 98% 96% 100%  Weight:      Height:       No data found.   Intake/Output  Summary (Last 24 hours) at 05/06/2024 1602 Last data filed at 05/06/2024 1300 Gross Bonilla 24 hour  Intake 1320 ml  Output --  Net 1320 ml   Filed Weights   05/03/24 2354  Weight: 59 kg    Exam:  GEN: NAD SKIN: Warm and dry EYES: No pallor or icterus ENT: MMM CV: RRR PULM: CTA B ABD: soft, ND, NT, +BS CNS: AAO x 3, non focal EXT: No edema or tenderness         Data Reviewed:   I have personally reviewed following labs and imaging studies:  Labs: Labs show the following:   Basic Metabolic Panel: Recent Labs  Lab 05/04/24 0002 05/05/24 0614 05/06/24 0428  NA 131* 133* 130*  K 3.6 3.7 3.9  CL 95* 98 97*  CO2 19* 25 27  GLUCOSE 233* 77 97  BUN 7 <5* <5*  CREATININE 0.91 0.95 0.90  CALCIUM 8.7* 8.0* 7.7*  MG  --  1.3* 2.0  PHOS  --  1.7* 2.0*   GFR Estimated Creatinine Clearance: 92 mL/min (by C-G formula based on SCr of 0.9 mg/dL). Liver Function Tests: Recent Labs  Lab 05/04/24 0002 05/05/24 0614 05/06/24 0428  AST 87* 3,090* 4,313*  ALT 38 618* 1,180*  ALKPHOS 91 98 95  BILITOT 1.4* 2.2* 1.2  PROT 7.1 6.3* 5.8*  ALBUMIN 3.8 3.4* 3.1*   Recent Labs  Lab 05/04/24 0002 05/05/24 0614 05/06/24 0428  LIPASE 1,643* 1,101* 223*   No results for input(s): AMMONIA in the last 168 hours. Coagulation profile Recent Labs  Lab 05/06/24 1400  INR 1.2    CBC: Recent Labs  Lab 05/04/24 0002 05/05/24 0614 05/06/24 0428  WBC 7.7 7.3 6.4  HGB 14.7 14.3 12.8*  HCT 40.7 40.7 36.1*  MCV 91.3 92.1 91.4  PLT 225 130* 98*   Cardiac Enzymes: No results for input(s): CKTOTAL, CKMB, CKMBINDEX, TROPONINI in the last 168 hours. BNP (last 3 results) No results for input(s): PROBNP in the last 8760 hours. CBG: No results for input(s): GLUCAP in the last 168 hours. D-Dimer: No results for input(s): DDIMER in the last 72 hours. Hgb A1c: Recent Labs    05/04/24 0002  HGBA1C 5.2   Lipid Profile: No results for input(s): CHOL,  HDL, LDLCALC, TRIG, CHOLHDL, LDLDIRECT in the last 72 hours. Thyroid function studies: No results for input(s): TSH, T4TOTAL, T3FREE, THYROIDAB in the last 72 hours.  Invalid input(s): FREET3 Anemia work up: No results for input(s): VITAMINB12, FOLATE, FERRITIN, TIBC, IRON, RETICCTPCT in the last 72 hours. Sepsis Labs: Recent Labs  Lab 05/04/24 0002 05/05/24 0614 05/06/24 0428  WBC 7.7 7.3 6.4    Microbiology No results found for this or any previous visit (from the past 240 hours).  Procedures and diagnostic studies:  US  Abdomen Limited RUQ (LIVER/GB) Result Date: 05/05/2024 EXAM: Right Upper Quadrant Abdominal Ultrasound 05/05/2024 03:57:24 PM TECHNIQUE: Real-time ultrasonography  of the right upper quadrant of the abdomen was performed. COMPARISON: CT 05/04/2024. CLINICAL HISTORY: Elevated liver enzymes. FINDINGS: LIVER: The liver is echogenic. Normal direction of portal flow is towards the liver. . No intrahepatic biliary ductal dilatation. No evidence of mass. BILIARY SYSTEM: Gallbladder wall thickness measures 0.3 cm. No pericholecystic fluid. No cholelithiasis. Common bile duct is within normal limits measuring 0.7 cm. OTHER: Trace perihepatic free fluid. Incidental note made of right pleural effusion. IMPRESSION: 1. Echogenic liver parenchyma consistent with hepatic steatosis. 2. Trace perihepatic fluid. Right pleural effusion Electronically signed by: Luke Bun MD 05/05/2024 09:27 PM EST RP Workstation: [id number]               LOS: 2 days   Jordy Verba  [facility]   Pager on [email or web address]. If 7PM-7AM, please contact night-coverage at [email or web address]     05/06/2024, 4:02 PM

## 2024-05-06 NOTE — Plan of Care (Signed)
  Problem: Clinical Measurements: Goal: Ability to maintain clinical measurements within normal limits will improve Outcome: Progressing Goal: Will remain free from infection Outcome: Progressing Goal: Diagnostic test results will improve Outcome: Progressing Goal: Respiratory complications will improve Outcome: Progressing   Problem: Health Behavior/Discharge Planning: Goal: Ability to manage health-related needs will improve Outcome: Progressing

## 2024-05-06 NOTE — Consult Note (Signed)
 PHARMACY MEDICATION CONSULT NOTE  ASSESSMENT: 38 y.o. male with PMH including pancreatitis, alcohol use, marijuana use, orthopedia fractures is presenting with concerns for acetaminophen  toxicity. Most recent intake within the past week. Pharmacy has been consulted to manage N-acetylcysteine  (NAC) treatment.  As part of this consult, [place]  [facility] [phone]) was contacted.     Latest Ref Rng & Units 05/06/2024    4:28 AM 05/05/2024    6:14 AM 05/04/2024   12:02 AM  Hepatic Function  Total Protein 6.5 - 8.1 g/dL 5.8  6.3  7.1   Albumin 3.5 - 5.0 g/dL 3.1  3.4  3.8   AST 15 - 41 U/L 4,313  3,090  87   ALT 0 - 44 U/L 1,180  618  38   Alk Phosphatase 38 - 126 U/L 95  98  91   Total Bilirubin 0.0 - 1.2 mg/dL 1.2  2.2  1.4     Goals of therapy: []  At least 24 h of NAC treatment []  Pt is asymptomatic; AST/ALT normalize; serum bicarb >=20 or pH >=7.25 []  INR <2 [x]  Acetaminophen  level <=10 mcg/mL  PLAN: Per discussion with [place]  [facility], will initiate treatment with a loading dose of NAC 150 mg/kg IV, followed by an IV infusion of NAC 15 mg/kg/hour. Name of Poison Control representative contacted: Sonny Monitor BMP, AST/ALT, and INR at 22 hours into maintenance infusion and every 24 hours thereafter.   Thank you,  Will M. Lenon, PharmD, BCPS Clinical Pharmacist 05/06/2024 1:25 PM

## 2024-05-07 LAB — CBC
HCT: 38.3 % — ABNORMAL LOW (ref 39.0–52.0)
Hemoglobin: 13.8 g/dL (ref 13.0–17.0)
MCH: 32.7 pg (ref 26.0–34.0)
MCHC: 36 g/dL (ref 30.0–36.0)
MCV: 90.8 fL (ref 80.0–100.0)
Platelets: 119 K/uL — ABNORMAL LOW (ref 150–400)
RBC: 4.22 MIL/uL (ref 4.22–5.81)
RDW: 13.2 % (ref 11.5–15.5)
WBC: 6.5 K/uL (ref 4.0–10.5)
nRBC: 0 % (ref 0.0–0.2)

## 2024-05-07 LAB — COMPREHENSIVE METABOLIC PANEL WITH GFR
ALT: 855 U/L — ABNORMAL HIGH (ref 0–44)
AST: 1072 U/L — ABNORMAL HIGH (ref 15–41)
Albumin: 3.4 g/dL — ABNORMAL LOW (ref 3.5–5.0)
Alkaline Phosphatase: 105 U/L (ref 38–126)
Anion gap: 11 (ref 5–15)
BUN: <5 mg/dL — ABNORMAL LOW (ref 6–20)
CO2: 23 mmol/L (ref 22–32)
Calcium: 8.1 mg/dL — ABNORMAL LOW (ref 8.9–10.3)
Chloride: 99 mmol/L (ref 98–111)
Creatinine, Ser: 0.82 mg/dL (ref 0.61–1.24)
GFR, Estimated: >60 mL/min (ref >60)
Glucose, Bld: 85 mg/dL (ref 70–99)
Potassium: 3.4 mmol/L — ABNORMAL LOW (ref 3.5–5.1)
Sodium: 134 mmol/L — ABNORMAL LOW (ref 135–145)
Total Bilirubin: 1.5 mg/dL — ABNORMAL HIGH (ref 0.0–1.2)
Total Protein: 7 g/dL (ref 6.5–8.1)

## 2024-05-07 LAB — HEPATITIS PANEL, ACUTE
HCV Ab: NONREACTIVE
Hep A IgM: NONREACTIVE
Hep B C IgM: NONREACTIVE
Hepatitis B Surface Ag: NONREACTIVE

## 2024-05-07 LAB — MAGNESIUM: Magnesium: 2.1 mg/dL (ref 1.7–2.4)

## 2024-05-07 LAB — PHOSPHORUS: Phosphorus: 2.4 mg/dL — ABNORMAL LOW (ref 2.5–4.6)

## 2024-05-07 MED ORDER — POTASSIUM CHLORIDE CRYS ER 20 MEQ PO TBCR: 40.0000 meq | EXTENDED_RELEASE_TABLET | Freq: Once | ORAL | Status: DC

## 2024-05-07 MED ORDER — CHLORDIAZEPOXIDE HCL 25 MG PO CAPS: 25.0000 mg | ORAL_CAPSULE | Freq: Two times a day (BID) | ORAL | Status: DC

## 2024-05-07 NOTE — Progress Notes (Signed)
 Responded to Pt room ref IV pump alarm. Pt not present. IV pump in pause mode with appropriate alarm. IV disconnected at hub and hanging over pole. IV with bandage intact on top of trash can. Pt belongings not present. No sign of bleeding present. Charge nurse contacted, Security contacted, missing person alert activated, AC contacted. This RN physically checked front , rear and ED entrances with no sign of Pt. Pt contact appx 30 min prior to alarm. Pt A&O 4 with pleasant demeanor, calm and cooperative and without complaint. Pt inquired as to whether he'd get to go home this morning and was told he'd need to speak with his MD, He stated I feel great, not sure I need to be here anymore. Pt was reassured that he would be able to D/C as soon as he was cleared medically. Pt called significant other and stated that he couldn't wait to see her and that he'd let her know when he could be picked up.  Security visited 1C and stated Pt was seen at main entrance getting into vehicle with a male. Same was casually carrying belongings and departed without incident.

## 2024-05-07 NOTE — Discharge Summary (Signed)
 DISCHARGE SUMMARY    Craig Bonilla FMW:[id number] DOB: 20-Nov-1985 DOA: 05/04/2024  PCP: Patient, No Pcp Per  Admit date: 05/04/2024 Discharge date: Patient left AMA 11/26  Craig Bonilla is a 38 year old male with history of polysubstance use disorder, history of pancreatitis, who presented to the hospital nausea, vomiting, abdominal pain.  Initial CT on arrival showed acute pancreatitis with significant peripancreatic fluid and pancreatic phlegmon with new 16 mm cystic changes in the pancreatic body. He was admitted for pain management.  While admitted patient developed severely elevated liver enzymes into the thousands.  Acute hepatitis panel was negative.  Gastroenterology was consulted.  Patient then admitted to taking about 28 pills of Tylenol  prior to admission.  Acetaminophen  and salicylic acid levels were unremarkable.  Patient was started on NAC. On 11/26 patient eloped from his room and eventually left AGAINST MEDICAL ADVICE.  I was not alerted that patient was leaving, and I was not able to evaluate the patient or participate in his discharge in any way.  Ongoing problems:  Elevated liver enzymes - AST 4313, ALT 1180 - Acute hep panel negative - Liver ultrasound with fatty liver no gallstones - GI consulted - Patient admitted to taking 28 pills of Tylenol  prior to admission, acetaminophen  and salicylate levels unremarkable.  Intended to consult behavioral health today but patient eloped before I was able to do so - Initiated on NAC  Acute pancreatitis Alcohol withdrawal Hyponatremia Hypomagnesemia Hypophosphatemia Thrombocytopenia Polysubstance abuse (alcohol, tobacco, marijuana)      Allergies  Allergen Reactions   Vicodin [Hydrocodone -Acetaminophen ] Itching    Consultations: Treatment Team:  Jinny Carmine, MD   Procedures/Studies: US  Abdomen Limited RUQ (LIVER/GB) Result Date: 05/05/2024 EXAM: Right Upper Quadrant Abdominal Ultrasound 05/05/2024  03:57:24 PM TECHNIQUE: Real-time ultrasonography of the right upper quadrant of the abdomen was performed. COMPARISON: CT 05/04/2024. CLINICAL HISTORY: Elevated liver enzymes. FINDINGS: LIVER: The liver is echogenic. Normal direction of portal flow is towards the liver. . No intrahepatic biliary ductal dilatation. No evidence of mass. BILIARY SYSTEM: Gallbladder wall thickness measures 0.3 cm. No pericholecystic fluid. No cholelithiasis. Common bile duct is within normal limits measuring 0.7 cm. OTHER: Trace perihepatic free fluid. Incidental note made of right pleural effusion. IMPRESSION: 1. Echogenic liver parenchyma consistent with hepatic steatosis. 2. Trace perihepatic fluid. Right pleural effusion Electronically signed by: Luke Bun MD 05/05/2024 09:27 PM EST RP Workstation: [id number]   CT ABDOMEN PELVIS W CONTRAST Result Date: 05/04/2024 EXAM: CT ABDOMEN AND PELVIS WITH CONTRAST 05/04/2024 02:48:08 AM TECHNIQUE: CT of the abdomen and pelvis was performed with the administration of intravenous contrast. 100 mL of iohexol  (OMNIPAQUE ) 300 MG/ML solution was administered. Multiplanar reformatted images are provided for review. Automated exposure control, iterative reconstruction, and/or weight-based adjustment of the mA/kV was utilized to reduce the radiation dose to as low as reasonably achievable. COMPARISON: 05/13/2019 CLINICAL HISTORY: epigastric pain and history of pancreatitis. FINDINGS: LOWER CHEST: Lung bases are clear. LIVER: The liver demonstrates diffuse fatty infiltration. GALLBLADDER AND BILE DUCTS: The gallbladder is decompressed. No biliary ductal dilatation. SPLEEN: The spleen is unremarkable. PANCREAS: The pancreas demonstrates significant peripancreatic fluid consistent with acute pancreatitis. Cystic changes are noted in the body of the pancreas, which may represent small pseudocysts. The largest of these measures 16 mm in dimension. This was not present on the prior exam. The  pancreatic phlegmon extends along the anterior aspect of Gerota's fascia and surrounds the second portion of the duodenum. ADRENAL GLANDS: The adrenal glands are unremarkable. KIDNEYS, URETERS AND  BLADDER: Kidneys demonstrate a normal enhancement pattern. No obstructive changes are seen. No calculi are noted. The bladder is partially distended. GI AND BOWEL: Stomach demonstrates no acute abnormality. The small bowel is otherwise within normal limits. No obstructive or inflammatory changes of the colon are seen. The appendix is within normal limits. There is no bowel obstruction. PERITONEUM AND RETROPERITONEUM: Mild free fluid is noted in the pelvis related to the underlying pancreatitis. No free air. VASCULATURE: The aorta demonstrates mild calcifications without an aneurysmal dilatation. LYMPH NODES: No lymphadenopathy. REPRODUCTIVE ORGANS: The prostate is within normal limits. BONES AND SOFT TISSUES: Bony structures are within normal limits. No focal soft tissue abnormality. IMPRESSION: 1. Acute pancreatitis with significant peripancreatic fluid and pancreatic phlegmon, with new 16 mm cystic changes in the pancreatic body possibly representing small pseudocysts. Electronically signed by: Oneil Devonshire MD 05/04/2024 02:56 AM EST RP Workstation: [id number]      Discharge Exam: Vitals:   05/06/24 2001 05/07/24 0503  BP: (!) 141/104 116/78  Pulse: 82 78  Resp: 16 16  Temp: 98.1 F (36.7 C) 98.4 F (36.9 C)  SpO2: 99% 100%   Vitals:   05/06/24 0814 05/06/24 1549 05/06/24 2001 05/07/24 0503  BP: 116/86 129/88 (!) 141/104 116/78  Pulse: 96 77 82 78  Resp: 18 17 16 16   Temp: 98.6 F (37 C) 99.6 F (37.6 C) 98.1 F (36.7 C) 98.4 F (36.9 C)  TempSrc: Oral Oral Oral Oral  SpO2: 96% 100% 99% 100%  Weight:      Height:         The results of significant diagnostics from this hospitalization (including imaging, microbiology, ancillary and laboratory) are listed below for reference.      Microbiology: No results found for this or any previous visit (from the past 240 hours).   Labs: BNP (last 3 results) No results for input(s): BNP in the last 8760 hours. Basic Metabolic Panel: Recent Labs  Lab 05/04/24 0002 05/05/24 0614 05/06/24 0428 05/07/24 0441  NA 131* 133* 130* 134*  K 3.6 3.7 3.9 3.4*  CL 95* 98 97* 99  CO2 19* 25 27 23   GLUCOSE 233* 77 97 85  BUN 7 <5* <5* <5*  CREATININE 0.91 0.95 0.90 0.82  CALCIUM 8.7* 8.0* 7.7* 8.1*  MG  --  1.3* 2.0 2.1  PHOS  --  1.7* 2.0* 2.4*   Liver Function Tests: Recent Labs  Lab 05/04/24 0002 05/05/24 0614 05/06/24 0428 05/07/24 0441  AST 87* 3,090* 4,313* 1,072*  ALT 38 618* 1,180* 855*  ALKPHOS 91 98 95 105  BILITOT 1.4* 2.2* 1.2 1.5*  PROT 7.1 6.3* 5.8* 7.0  ALBUMIN 3.8 3.4* 3.1* 3.4*   Recent Labs  Lab 05/04/24 0002 05/05/24 0614 05/06/24 0428  LIPASE 1,643* 1,101* 223*   No results for input(s): AMMONIA in the last 168 hours. CBC: Recent Labs  Lab 05/04/24 0002 05/05/24 0614 05/06/24 0428 05/07/24 0441  WBC 7.7 7.3 6.4 6.5  HGB 14.7 14.3 12.8* 13.8  HCT 40.7 40.7 36.1* 38.3*  MCV 91.3 92.1 91.4 90.8  PLT 225 130* 98* 119*   Cardiac Enzymes: No results for input(s): CKTOTAL, CKMB, CKMBINDEX, TROPONINI in the last 168 hours. BNP: Invalid input(s): POCBNP CBG: No results for input(s): GLUCAP in the last 168 hours. D-Dimer No results for input(s): DDIMER in the last 72 hours. Hgb A1c No results for input(s): HGBA1C in the last 72 hours. Lipid Profile No results for input(s): CHOL, HDL, LDLCALC, TRIG, CHOLHDL, LDLDIRECT in the  last 72 hours. Thyroid function studies No results for input(s): TSH, T4TOTAL, T3FREE, THYROIDAB in the last 72 hours.  Invalid input(s): FREET3 Anemia work up No results for input(s): VITAMINB12, FOLATE, FERRITIN, TIBC, IRON, RETICCTPCT in the last 72 hours. Urinalysis    Component Value Date/Time    COLORURINE AMBER (A) 05/03/2024 2356   APPEARANCEUR CLEAR (A) 05/03/2024 2356   LABSPEC 1.041 (H) 05/03/2024 2356   PHURINE 5.0 05/03/2024 2356   GLUCOSEU 50 (A) 05/03/2024 2356   HGBUR NEGATIVE 05/03/2024 2356   BILIRUBINUR NEGATIVE 05/03/2024 2356   KETONESUR NEGATIVE 05/03/2024 2356   PROTEINUR 30 (A) 05/03/2024 2356   NITRITE NEGATIVE 05/03/2024 2356   LEUKOCYTESUR NEGATIVE 05/03/2024 2356   Sepsis Labs Recent Labs  Lab 05/04/24 0002 05/05/24 0614 05/06/24 0428 05/07/24 0441  WBC 7.7 7.3 6.4 6.5   Microbiology No results found for this or any previous visit (from the past 240 hours).   SIGNED: Valeria Boza, DO [facility] 05/07/2024, 1:16 PM Pager   If 7PM-7AM, please contact night-coverage

## 2024-05-07 NOTE — Plan of Care (Signed)
 Pt has left against medical advice and without informing his RN or any staff.  Tried to reach pt by phone and was only able to leave a msg.  Pt left w/IV intact.

## 2024-05-07 NOTE — Plan of Care (Signed)
  Problem: Clinical Measurements: Goal: Ability to maintain clinical measurements within normal limits will improve Outcome: Progressing Goal: Cardiovascular complication will be avoided Outcome: Progressing   Problem: Nutrition: Goal: Adequate nutrition will be maintained Outcome: Progressing   Problem: Elimination: Goal: Will not experience complications related to urinary retention Outcome: Progressing

## 2024-05-08 ENCOUNTER — Encounter: Payer: Self-pay | Admitting: Emergency Medicine

## 2024-05-08 ENCOUNTER — Other Ambulatory Visit: Payer: Self-pay

## 2024-05-08 ENCOUNTER — Emergency Department: Payer: Self-pay

## 2024-05-08 ENCOUNTER — Emergency Department
Admission: EM | Admit: 2024-05-08 | Discharge: 2024-05-08 | Disposition: A | Discharge status: Home / Self Care | Payer: Self-pay

## 2024-05-08 DIAGNOSIS — K852 Alcohol induced acute pancreatitis without necrosis or infection: Secondary | ICD-10-CM | POA: Insufficient documentation

## 2024-05-08 LAB — CBC WITH DIFFERENTIAL/PLATELET
Abs Immature Granulocytes: 0.03 K/uL (ref 0.00–0.07)
Basophils Absolute: 0 K/uL (ref 0.0–0.1)
Basophils Relative: 1 %
Eosinophils Absolute: 0.2 K/uL (ref 0.0–0.5)
Eosinophils Relative: 3 %
HCT: 37.4 % — ABNORMAL LOW (ref 39.0–52.0)
Hemoglobin: 13 g/dL (ref 13.0–17.0)
Immature Granulocytes: 0 %
Lymphocytes Relative: 16 %
Lymphs Abs: 1.1 K/uL (ref 0.7–4.0)
MCH: 32.3 pg (ref 26.0–34.0)
MCHC: 34.8 g/dL (ref 30.0–36.0)
MCV: 92.8 fL (ref 80.0–100.0)
Monocytes Absolute: 1.4 K/uL — ABNORMAL HIGH (ref 0.1–1.0)
Monocytes Relative: 20 %
Neutro Abs: 4.1 K/uL (ref 1.7–7.7)
Neutrophils Relative %: 60 %
Platelets: 149 K/uL — ABNORMAL LOW (ref 150–400)
RBC: 4.03 MIL/uL — ABNORMAL LOW (ref 4.22–5.81)
RDW: 13.8 % (ref 11.5–15.5)
WBC: 6.8 K/uL (ref 4.0–10.5)
nRBC: 0.4 % — ABNORMAL HIGH (ref 0.0–0.2)

## 2024-05-08 LAB — COMPREHENSIVE METABOLIC PANEL WITH GFR
ALT: 495 U/L — ABNORMAL HIGH (ref 0–44)
AST: 252 U/L — ABNORMAL HIGH (ref 15–41)
Albumin: 3.5 g/dL (ref 3.5–5.0)
Alkaline Phosphatase: 100 U/L (ref 38–126)
Anion gap: 9 (ref 5–15)
BUN: <5 mg/dL — ABNORMAL LOW (ref 6–20)
CO2: 24 mmol/L (ref 22–32)
Calcium: 8.8 mg/dL — ABNORMAL LOW (ref 8.9–10.3)
Chloride: 103 mmol/L (ref 98–111)
Creatinine, Ser: 0.87 mg/dL (ref 0.61–1.24)
GFR, Estimated: >60 mL/min (ref >60)
Glucose, Bld: 119 mg/dL — ABNORMAL HIGH (ref 70–99)
Potassium: 3.7 mmol/L (ref 3.5–5.1)
Sodium: 135 mmol/L (ref 135–145)
Total Bilirubin: 1.3 mg/dL — ABNORMAL HIGH (ref 0.0–1.2)
Total Protein: 7 g/dL (ref 6.5–8.1)

## 2024-05-08 LAB — PROTIME-INR
INR: 1 (ref 0.8–1.2)
Prothrombin Time: 13.6 s (ref 11.4–15.2)

## 2024-05-08 LAB — LIPASE, BLOOD: Lipase: 131 U/L — ABNORMAL HIGH (ref 11–51)

## 2024-05-08 MED ORDER — SODIUM CHLORIDE 0.9 % IV BOLUS
1000.0000 mL | Freq: Once | INTRAVENOUS | Status: AC
  Administered 2024-05-08: 1000 mL via INTRAVENOUS

## 2024-05-08 MED ORDER — HYDROMORPHONE HCL 1 MG/ML IJ SOLN
0.5000 mg | Freq: Once | INTRAMUSCULAR | Status: AC
  Administered 2024-05-08: 0.5 mg via INTRAVENOUS
  Filled 2024-05-08: qty 0.5

## 2024-05-08 MED ORDER — IOHEXOL 300 MG/ML  SOLN
100.0000 mL | Freq: Once | INTRAMUSCULAR | Status: AC | PRN
  Administered 2024-05-08: 100 mL via INTRAVENOUS
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
## 2024-05-08 NOTE — Discharge Instructions (Signed)
 Please follow-up with gastroenterology.  It is very important that you stop drinking alcohol as we discussed.  Please return for any new, worsening, or changing symptoms or other concerns.  It was a pleasure caring for you today.

## 2024-05-08 NOTE — ED Notes (Signed)
 See triage note  Presents with some abd pain  States he was seen yesterday  And they wants to admit him  but he need to leave d/t family emergency Cont's to have abd pain
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
